# Patient Record
Sex: Male | Born: 1965 | Race: Black or African American | Hispanic: No | Marital: Married | Smoking: Current every day smoker
Health system: Southern US, Community
[De-identification: ages and names within clinical notes are randomized; demographics above are authoritative.]

## PROBLEM LIST (undated history)

## (undated) DIAGNOSIS — K449 Diaphragmatic hernia without obstruction or gangrene: Secondary | ICD-10-CM

## (undated) DIAGNOSIS — J45909 Unspecified asthma, uncomplicated: Secondary | ICD-10-CM

## (undated) DIAGNOSIS — I10 Essential (primary) hypertension: Secondary | ICD-10-CM

## (undated) DIAGNOSIS — E119 Type 2 diabetes mellitus without complications: Secondary | ICD-10-CM

## (undated) DIAGNOSIS — M1990 Unspecified osteoarthritis, unspecified site: Secondary | ICD-10-CM

## (undated) HISTORY — PX: PROSTATE SURGERY: SHX751

---

## 2016-07-09 ENCOUNTER — Other Ambulatory Visit: Payer: Self-pay

## 2016-07-30 ENCOUNTER — Ambulatory Visit: Payer: Managed Care, Other (non HMO) | Admitting: Urology

## 2016-08-20 ENCOUNTER — Encounter: Payer: Self-pay | Admitting: Urology

## 2016-08-20 ENCOUNTER — Ambulatory Visit: Payer: Managed Care, Other (non HMO) | Admitting: Urology

## 2016-09-12 ENCOUNTER — Ambulatory Visit: Payer: Managed Care, Other (non HMO) | Admitting: Urology

## 2016-09-12 ENCOUNTER — Encounter: Payer: Self-pay | Admitting: Urology

## 2017-02-18 ENCOUNTER — Emergency Department: Payer: Managed Care, Other (non HMO)

## 2017-02-18 ENCOUNTER — Emergency Department
Admission: EM | Admit: 2017-02-18 | Discharge: 2017-02-18 | Disposition: A | Discharge status: Home / Self Care | Payer: Managed Care, Other (non HMO) | Attending: Emergency Medicine | Admitting: Emergency Medicine

## 2017-02-18 ENCOUNTER — Other Ambulatory Visit: Payer: Self-pay

## 2017-02-18 ENCOUNTER — Encounter: Payer: Self-pay | Admitting: Emergency Medicine

## 2017-02-18 DIAGNOSIS — R339 Retention of urine, unspecified: Secondary | ICD-10-CM | POA: Insufficient documentation

## 2017-02-18 DIAGNOSIS — R109 Unspecified abdominal pain: Secondary | ICD-10-CM

## 2017-02-18 DIAGNOSIS — I10 Essential (primary) hypertension: Secondary | ICD-10-CM | POA: Diagnosis not present

## 2017-02-18 DIAGNOSIS — E119 Type 2 diabetes mellitus without complications: Secondary | ICD-10-CM | POA: Diagnosis not present

## 2017-02-18 HISTORY — DX: Diaphragmatic hernia without obstruction or gangrene: K44.9

## 2017-02-18 HISTORY — DX: Essential (primary) hypertension: I10

## 2017-02-18 HISTORY — DX: Type 2 diabetes mellitus without complications: E11.9

## 2017-02-18 LAB — URINALYSIS, COMPLETE (UACMP) WITH MICROSCOPIC
Bacteria, UA: NONE SEEN
Bilirubin Urine: NEGATIVE
Glucose, UA: >=500 mg/dL — AB
Hgb urine dipstick: NEGATIVE
Ketones, ur: NEGATIVE mg/dL
LEUKOCYTES UA: NEGATIVE
Nitrite: NEGATIVE
PH: 5 (ref 5.0–8.0)
Protein, ur: NEGATIVE mg/dL
SPECIFIC GRAVITY, URINE: 1.024 (ref 1.005–1.030)

## 2017-02-18 LAB — BASIC METABOLIC PANEL
ANION GAP: 13 (ref 5–15)
BUN: 21 mg/dL — ABNORMAL HIGH (ref 6–20)
CHLORIDE: 102 mmol/L (ref 101–111)
CO2: 21 mmol/L — AB (ref 22–32)
Calcium: 9.8 mg/dL (ref 8.9–10.3)
Creatinine, Ser: 1.17 mg/dL (ref 0.61–1.24)
GFR calc non Af Amer: >60 mL/min (ref >60)
Glucose, Bld: 233 mg/dL — ABNORMAL HIGH (ref 65–99)
Potassium: 4.1 mmol/L (ref 3.5–5.1)
Sodium: 136 mmol/L (ref 135–145)

## 2017-02-18 LAB — CBC
HCT: 41 % (ref 40.0–52.0)
HEMOGLOBIN: 13.8 g/dL (ref 13.0–18.0)
MCH: 28.3 pg (ref 26.0–34.0)
MCHC: 33.6 g/dL (ref 32.0–36.0)
MCV: 84.1 fL (ref 80.0–100.0)
Platelets: 226 10*3/uL (ref 150–440)
RBC: 4.87 MIL/uL (ref 4.40–5.90)
RDW: 13.7 % (ref 11.5–14.5)
WBC: 5.1 10*3/uL (ref 3.8–10.6)

## 2017-02-18 MED ORDER — TAMSULOSIN HCL 0.4 MG PO CAPS: 0.4000 mg | ORAL_CAPSULE | Freq: Every day | ORAL | 0 refills | Status: DC

## 2017-02-18 MED ORDER — OXYCODONE-ACETAMINOPHEN 5-325 MG PO TABS: 1.0000 | ORAL_TABLET | Freq: Four times a day (QID) | ORAL | 0 refills | Status: DC | PRN

## 2017-02-18 NOTE — ED Triage Notes (Signed)
Pt reports left side flank pain and difficulty urinating for one day. Denies nausea or vomiting. Pt ambulatory to triage, no apparent distress noted.

## 2017-02-18 NOTE — ED Notes (Signed)
Bladder scan 278 mL

## 2017-02-18 NOTE — ED Notes (Signed)
Pt has on bladder scan post urination. EDP notified.

## 2017-02-18 NOTE — ED Notes (Signed)
Pt c/o left flank pain and pressure/pain with urination for the past day..Marland Kitchen

## 2017-02-18 NOTE — ED Provider Notes (Signed)
[facility] Emergency Department Provider Note  ____________________________________________   First MD Initiated Contact with Patient 02/18/17 1451     (approximate)  I have reviewed the triage vital signs and the nursing notes.   HISTORY  Chief Complaint Flank Pain   HPI Michael Orozco is a 51 y.o. male with a history of kidney stones as well as hypertension and diabetes who is presenting to the emergency department 2 days of left lower back pain as well as difficulty urinating that just started today.  He says that the pain in his back is a 7 out of 10 and feels like a cramping and sharp pain.  He says that it is improved when he gets up and walks around.  Denies any heavy lifting or known injury.  Says that he also had difficulty urinating today but was able to have a good void just prior to my exam, about 20 minutes prior, and feels like he is completely empty at this time.  Says that during that time he had pain in his lower abdomen that radiated into his testicles.  Denies seeing any blood in his urine.  Says that he also takes medicine for his diabetes but is not taking any today.  Denies any nausea, vomiting or diarrhea.  Patient denies any new medications and specifically asked about allergy medications are Benadryl and he denies.  Past Medical History:  Diagnosis Date  . Diabetes mellitus without complication (HCC)   . Hiatal hernia   . Hypertension     There are no active problems to display for this patient.   History reviewed. No pertinent surgical history.  Prior to Admission medications   Not on File    Allergies Patient has no known allergies.  No family history on file.  Social History Social History   Tobacco Use  . Smoking status: Not on file  Substance Use Topics  . Alcohol use: Not on file  . Drug use: Not on file    Review of Systems  Constitutional: No fever/chills Eyes: No visual changes. ENT: No sore  throat. Cardiovascular: Denies chest pain. Respiratory: Denies shortness of breath. Gastrointestinal: No nausea, no vomiting.  No diarrhea.  No constipation. Genitourinary: Negative for dysuria. Musculoskeletal: As above Skin: Negative for rash. Neurological: Negative for headaches, focal weakness or numbness.   ____________________________________________   PHYSICAL EXAM:  VITAL SIGNS: ED Triage Vitals  Enc Vitals Group     BP 02/18/17 1349 (!) 159/94     Pulse Rate 02/18/17 1349 75     Resp 02/18/17 1349 18     Temp 02/18/17 1349 98.2 F (36.8 C)     Temp Source 02/18/17 1349 Oral     SpO2 02/18/17 1349 97 %     Weight 02/18/17 1349 210 lb (95.3 kg)     Height 02/18/17 1349 6' (1.829 m)     Head Circumference --      Peak Flow --      Pain Score 02/18/17 1348 7     Pain Loc --      Pain Edu? --      Excl. in [place]? --     Constitutional: Alert and oriented. Well appearing and in no acute distress. Eyes: Conjunctivae are normal.  Head: Atraumatic. Nose: No congestion/rhinnorhea. Mouth/Throat: Mucous membranes are moist.  Neck: No stridor.   Cardiovascular: Normal rate, regular rhythm. Grossly normal heart sounds.  Good peripheral circulation. Respiratory: Normal respiratory effort.  No retractions. Lungs CTAB.  Gastrointestinal: Soft and nontender. No distention. No CVA tenderness. Musculoskeletal: No lower extremity tenderness nor edema.  No joint effusions. Neurologic:  Normal speech and language. No gross focal neurologic deficits are appreciated. Skin:  Skin is warm, dry and intact. No rash noted. Psychiatric: Mood and affect are normal. Speech and behavior are normal.  ____________________________________________   LABS (all labs ordered are listed, but only abnormal results are displayed)  Labs Reviewed  URINALYSIS, COMPLETE (UACMP) WITH MICROSCOPIC - Abnormal; Notable for the following components:      Result Value   Color, Urine YELLOW (*)    APPearance  CLEAR (*)    Glucose, UA >=500 (*)    Squamous Epithelial / LPF 0-5 (*)    All other components within normal limits  BASIC METABOLIC PANEL - Abnormal; Notable for the following components:   CO2 21 (*)    Glucose, Bld 233 (*)    BUN 21 (*)    All other components within normal limits  CBC   ____________________________________________  EKG   ____________________________________________  RADIOLOGY  Initial post void residual taken about 30 minutes after urination was 278.  We will have the patient urinate once again and immediately take the post void residual.  Formal postvoid residual at 190.  Small cysts on the ultrasound but without any other pathology. ____________________________________________   PROCEDURES  Procedure(s) performed:   Procedures  Critical Care performed:   ____________________________________________   INITIAL IMPRESSION / ASSESSMENT AND PLAN / ED COURSE  Pertinent labs & imaging results that were available during my care of the patient were reviewed by me and considered in my medical decision making (see chart for details).  Differential diagnosis includes, but is not limited to, acute appendicitis, renal colic, testicular torsion, urinary tract infection/pyelonephritis, prostatitis,  epididymitis, diverticulitis, small bowel obstruction or ileus, colitis, abdominal aortic aneurysm, gastroenteritis, hernia, etc.  As part of my medical decision making, I reviewed the following data within the electronic MEDICAL RECORD NUMBER Notes from prior ED visits   ----------------------------------------- 5:01 PM on 02/18/2017 -----------------------------------------  Patient was still abnormal post void residual.  However, does not appear to be in any discomfort.  Unclear reason for the patient's urinary obstruction.  We will start with Flomax at home and will give patient Percocet for possible stone that was unseen on ultrasound.  Patient to follow-up  with urology.  He knows to return for any worsening or concerning symptoms especially worsening abdominal pain, fever or nausea or vomiting.  He is understanding of this plan willing to comply.     ____________________________________________   FINAL CLINICAL IMPRESSION(S) / ED DIAGNOSES  Urinary retention.  Flank pain.    NEW MEDICATIONS STARTED DURING THIS VISIT:  This SmartLink is deprecated. Use AVSMEDLIST instead to display the medication list for a patient.   Note:  This document was prepared using Dragon voice recognition software and may include unintentional dictation errors.     Myrna BlazerSchaevitz, Arlee Santosuosso Matthew, MD 02/18/17 [phone]

## 2019-02-23 ENCOUNTER — Other Ambulatory Visit: Payer: Self-pay

## 2019-02-23 ENCOUNTER — Emergency Department
Admission: EM | Admit: 2019-02-23 | Discharge: 2019-02-23 | Disposition: A | Discharge status: Home / Self Care | Payer: Managed Care, Other (non HMO) | Attending: Emergency Medicine | Admitting: Emergency Medicine

## 2019-02-23 ENCOUNTER — Emergency Department: Payer: Managed Care, Other (non HMO)

## 2019-02-23 ENCOUNTER — Encounter: Payer: Self-pay | Admitting: Medical Oncology

## 2019-02-23 DIAGNOSIS — R109 Unspecified abdominal pain: Secondary | ICD-10-CM

## 2019-02-23 DIAGNOSIS — Z79899 Other long term (current) drug therapy: Secondary | ICD-10-CM | POA: Insufficient documentation

## 2019-02-23 DIAGNOSIS — R300 Dysuria: Secondary | ICD-10-CM | POA: Insufficient documentation

## 2019-02-23 DIAGNOSIS — R110 Nausea: Secondary | ICD-10-CM | POA: Insufficient documentation

## 2019-02-23 DIAGNOSIS — I10 Essential (primary) hypertension: Secondary | ICD-10-CM | POA: Insufficient documentation

## 2019-02-23 DIAGNOSIS — E119 Type 2 diabetes mellitus without complications: Secondary | ICD-10-CM | POA: Insufficient documentation

## 2019-02-23 DIAGNOSIS — R1032 Left lower quadrant pain: Secondary | ICD-10-CM | POA: Insufficient documentation

## 2019-02-23 LAB — URINALYSIS, COMPLETE (UACMP) WITH MICROSCOPIC
Bacteria, UA: NONE SEEN
Bilirubin Urine: NEGATIVE
Glucose, UA: NEGATIVE mg/dL
Hgb urine dipstick: NEGATIVE
Ketones, ur: NEGATIVE mg/dL
Leukocytes,Ua: NEGATIVE
Nitrite: NEGATIVE
Protein, ur: NEGATIVE mg/dL
Specific Gravity, Urine: 1.009 (ref 1.005–1.030)
pH: 6 (ref 5.0–8.0)

## 2019-02-23 LAB — CBC
HCT: 42.1 % (ref 39.0–52.0)
Hemoglobin: 14.1 g/dL (ref 13.0–17.0)
MCH: 28.8 pg (ref 26.0–34.0)
MCHC: 33.5 g/dL (ref 30.0–36.0)
MCV: 86.1 fL (ref 80.0–100.0)
Platelets: 239 10*3/uL (ref 150–400)
RBC: 4.89 MIL/uL (ref 4.22–5.81)
RDW: 12.4 % (ref 11.5–15.5)
WBC: 6.2 10*3/uL (ref 4.0–10.5)
nRBC: 0 % (ref 0.0–0.2)

## 2019-02-23 LAB — BASIC METABOLIC PANEL
Anion gap: 8 (ref 5–15)
BUN: 12 mg/dL (ref 6–20)
CO2: 23 mmol/L (ref 22–32)
Calcium: 9.5 mg/dL (ref 8.9–10.3)
Chloride: 108 mmol/L (ref 98–111)
Creatinine, Ser: 0.94 mg/dL (ref 0.61–1.24)
GFR calc Af Amer: >60 mL/min (ref >60)
GFR calc non Af Amer: >60 mL/min (ref >60)
Glucose, Bld: 90 mg/dL (ref 70–99)
Potassium: 4.3 mmol/L (ref 3.5–5.1)
Sodium: 139 mmol/L (ref 135–145)

## 2019-02-23 MED ORDER — OXYCODONE-ACETAMINOPHEN 5-325 MG PO TABS
1.0000 | ORAL_TABLET | Freq: Once | ORAL | Status: AC
Start: 2019-02-23 — End: 2019-02-23
  Administered 2019-02-23: 1 via ORAL
  Filled 2019-02-23: qty 1

## 2019-02-23 MED ORDER — HALOPERIDOL LACTATE 5 MG/ML IJ SOLN
2.0000 mg | Freq: Once | INTRAMUSCULAR | Status: DC
Start: 2019-02-23 — End: 2019-02-23

## 2019-02-23 MED ORDER — CYCLOBENZAPRINE HCL 5 MG PO TABS: 5.0000 mg | ORAL_TABLET | Freq: Three times a day (TID) | ORAL | 0 refills | Status: DC | PRN

## 2019-02-23 MED ORDER — ONDANSETRON 4 MG PO TBDP
4.0000 mg | ORAL_TABLET | Freq: Once | ORAL | Status: AC
  Administered 2019-02-23: 4 mg via ORAL
  Filled 2019-02-23: qty 1

## 2019-02-23 NOTE — ED Provider Notes (Signed)
[facility] Emergency Department Provider Note _______________   First MD Initiated Contact with Patient 02/23/19 1158     (approximate)  I have reviewed the triage vital signs and the nursing notes.   HISTORY  Chief Complaint Flank Pain, Dysuria, and Nausea    HPI Michael Orozco is a 53 y.o. male with below list of previous medical conditions presents to the emergency department with a 10-day history of left flank pain that is currently 8 out of 10.  Patient does admit to some urinary pressure however no pain.  Patient denies any hematuria.  Patient does admit to nausea however no fever.        Past Medical History:  Diagnosis Date  . Diabetes mellitus without complication ([place])   . Hiatal hernia   . Hypertension     There are no active problems to display for this patient.   History reviewed. No pertinent surgical history.  Prior to Admission medications   Medication Sig Start Date End Date Taking? Authorizing Provider  oxyCODONE-acetaminophen (ROXICET) 5-325 MG tablet Take 1-2 tablets by mouth every 6 (six) hours as needed. 02/18/17   Schaevitz, Randall An, MD  tamsulosin (FLOMAX) 0.4 MG CAPS capsule Take 1 capsule (0.4 mg total) by mouth daily. 02/18/17   Schaevitz, Randall An, MD    Allergies Patient has no known allergies.  No family history on file.  Social History Social History   Tobacco Use  . Smoking status: Not on file  Substance Use Topics  . Alcohol use: Not on file  . Drug use: Not on file    Review of Systems Constitutional: No fever/chills Eyes: No visual changes. ENT: No sore throat. Cardiovascular: Denies chest pain. Respiratory: Denies shortness of breath. Gastrointestinal: .  Positive for left flank pain and nausea Genitourinary: Negative for dysuria. Musculoskeletal: Negative for neck pain.  Negative for back pain. Integumentary: Negative for rash. Neurological: Negative for headaches, focal  weakness or numbness.   ____________________________________________   PHYSICAL EXAM:  VITAL SIGNS: ED Triage Vitals [02/23/19 1128]  Enc Vitals Group     BP (!) 190/99     Pulse Rate 76     Resp 18     Temp 98.2 F (36.8 C)     Temp Source Oral     SpO2 99 %     Weight 86.2 kg (190 lb)     Height 1.829 m (6')     Head Circumference      Peak Flow      Pain Score 8     Pain Loc      Pain Edu?      Excl. in [place]?     Constitutional: Alert and oriented.  Eyes: Conjunctivae are normal.  Mouth/Throat: Patient is wearing a mask. Neck: No stridor.  No meningeal signs.   Cardiovascular: Normal rate, regular rhythm. Good peripheral circulation. Grossly normal heart sounds. Respiratory: Normal respiratory effort.  No retractions. Gastrointestinal: Soft and nontender. No distention.  Musculoskeletal: No lower extremity tenderness nor edema. No gross deformities of extremities. Neurologic:  Normal speech and language. No gross focal neurologic deficits are appreciated.  Skin:  Skin is warm, dry and intact. Psychiatric: Mood and affect are normal. Speech and behavior are normal.  ____________________________________________   LABS (all labs ordered are listed, but only abnormal results are displayed)  Labs Reviewed  URINALYSIS, COMPLETE (UACMP) WITH MICROSCOPIC - Abnormal; Notable for the following components:      Result Value   Color,  Urine YELLOW (*)    APPearance CLEAR (*)    All other components within normal limits  BASIC METABOLIC PANEL  CBC    RADIOLOGY I, [place] N , personally viewed and evaluated these images (plain radiographs) as part of my medical decision making, as well as reviewing the written report by the radiologist.   Official radiology report(s):  CLINICAL DATA:  Left flank pain  EXAM: CT ABDOMEN AND PELVIS WITHOUT CONTRAST  TECHNIQUE: Multidetector CT imaging of the abdomen and pelvis was performed following the standard protocol  without IV contrast.  COMPARISON:  07/23/2017  FINDINGS: Lower chest: No acute abnormality.  Hepatobiliary: No focal liver abnormality is seen. No gallstones, gallbladder wall thickening, or biliary dilatation.  Pancreas: Unremarkable.  Spleen: Unremarkable.  Adrenals/Urinary Tract: Adrenals are unremarkable. Bilateral renal cysts. Probable punctate nonobstructing calculi at the left lower pole and collecting system seen best on coronal imaging. No ureteral stone identified. Circumferential bladder wall thickening.  Stomach/Bowel: Stomach is unremarkable. Bowel is normal and caliber. Colonic diverticulosis. Appendix is normal.  Vascular/Lymphatic: No adenopathy.  Aortic atherosclerosis.  Reproductive: Prominent prostate indenting the bladder base.  Other: No ascites.  Musculoskeletal: No acute or significant osseous abnormality.  IMPRESSION: Probable few punctate left renal calculi.  Circumferential bladder wall thickening likely related to chronic outlet obstruction.   Electronically Signed   By: Guadlupe Spanish M.D.   On: 02/23/2019 13:11 ____________________________________________   Procedures   ____________________________________________   INITIAL IMPRESSION / MDM / ASSESSMENT AND PLAN / ED COURSE  As part of my medical decision making, I reviewed the following data within the electronic MEDICAL RECORD NUMBER   52 year old male presented with above-stated history and physical exam secondary to left flank pain.  Considered possibly of ureterolithiasis nephrolithiasis diverticulitis.  CT scan revealed few punctate stones.  ____________________________________________  FINAL CLINICAL IMPRESSION(S) / ED DIAGNOSES  Final diagnoses:  Flank pain     MEDICATIONS GIVEN DURING THIS VISIT:  Medications - No data to display   ED Discharge Orders    None      *Please note:  Michael Orozco was evaluated in Emergency Department on 02/23/2019  for the symptoms described in the history of present illness. He was evaluated in the context of the global COVID-19 pandemic, which necessitated consideration that the patient might be at risk for infection with the SARS-CoV-2 virus that causes COVID-19. Institutional protocols and algorithms that pertain to the evaluation of patients at risk for COVID-19 are in a state of rapid change based on information released by regulatory bodies including the CDC and federal and state organizations. These policies and algorithms were followed during the patient's care in the ED.  Some ED evaluations and interventions may be delayed as a result of limited staffing during the pandemic.*  Note:  This document was prepared using Dragon voice recognition software and may include unintentional dictation errors.   Darci Current, MD 02/27/19 2122

## 2019-02-23 NOTE — ED Notes (Signed)
Patient transported to CT 

## 2019-02-23 NOTE — ED Triage Notes (Signed)
Pt reports he began having left sided flank pain 10 days ago- pt reports she has been having pain with urination for a few days with nausea.

## 2019-02-23 NOTE — ED Notes (Signed)
Pt reports he has been out of his BP meds for about 3 days.

## 2019-02-23 NOTE — ED Provider Notes (Signed)
1:22 PM Assumed care for off going team.   Blood pressure (!) 190/99, pulse 76, temperature 98.2 F (36.8 C), temperature source Oral, resp. rate 18, height 6' (1.829 m), weight 86.2 kg, SpO2 99 %.  See their HPI for full report but in brief patient pending CT scan.  If negative can be discharged home.  CT scan without evidence of kidney stone.  Does have chronic outlet obstruction.  I did discuss with patient he is not interested in having a Foley or having a bladder scan done at this time to evaluate for foley.  He already has a urologist that he can follow-up with.  He says he still able to urinate though does have some pressure with it and he has a history of an enlarged prostate most likely causing the above on the CT scan. No evidence of hydro/kidney function to suggest severe obstruction.  Patient will follow-up with his PCP for above as well as his urologist for above.   I discussed the provisional nature of ED diagnosis, the treatment so far, the ongoing plan of care, follow up appointments and return precautions with the patient and any family or support people present. They expressed understanding and agreed with the plan, discharged home.           Vanessa Loretto, MD 02/23/19 1324

## 2019-02-23 NOTE — Discharge Instructions (Signed)
Your CT scan was reassuring however there is concern for enlarged prostate patient follow-up with urology for this.  This is most likely musculoskeletal in nature.  Take 1 g of Tylenol every 8 hours.  Your blood pressure was also elevated.  ollow-up with your primary care doctor as well.  Return the ER for fevers, worsening pain or any other concerns  IMPRESSION:  Probable few punctate left renal calculi.     Circumferential bladder wall thickening likely related to chronic  outlet obstruction.

## 2019-03-31 ENCOUNTER — Ambulatory Visit: Payer: BC Managed Care – PPO | Attending: Internal Medicine

## 2019-03-31 DIAGNOSIS — Z20822 Contact with and (suspected) exposure to covid-19: Secondary | ICD-10-CM | POA: Insufficient documentation

## 2019-04-02 ENCOUNTER — Telehealth: Payer: Self-pay | Admitting: *Deleted

## 2019-04-02 LAB — NOVEL CORONAVIRUS, NAA: SARS-CoV-2, NAA: NOT DETECTED

## 2019-04-02 NOTE — Telephone Encounter (Signed)
Patient calling for COVID lab results, results negative, verbalized understanding.  

## 2019-04-02 NOTE — Telephone Encounter (Signed)
Patient called and would like results faxed to his employer [employer] at [phone]

## 2019-04-03 ENCOUNTER — Telehealth: Payer: Self-pay

## 2019-04-03 NOTE — Telephone Encounter (Signed)
Result faxed as requested. 

## 2019-09-15 ENCOUNTER — Emergency Department: Payer: BC Managed Care – PPO

## 2019-09-15 ENCOUNTER — Other Ambulatory Visit: Payer: Self-pay

## 2019-09-15 ENCOUNTER — Emergency Department
Admission: EM | Admit: 2019-09-15 | Discharge: 2019-09-15 | Disposition: A | Discharge status: Home / Self Care | Payer: BC Managed Care – PPO | Attending: Student in an Organized Health Care Education/Training Program | Admitting: Student in an Organized Health Care Education/Training Program

## 2019-09-15 ENCOUNTER — Encounter: Payer: Self-pay | Admitting: Emergency Medicine

## 2019-09-15 DIAGNOSIS — I10 Essential (primary) hypertension: Secondary | ICD-10-CM | POA: Insufficient documentation

## 2019-09-15 DIAGNOSIS — X58XXXA Exposure to other specified factors, initial encounter: Secondary | ICD-10-CM | POA: Insufficient documentation

## 2019-09-15 DIAGNOSIS — Y929 Unspecified place or not applicable: Secondary | ICD-10-CM | POA: Insufficient documentation

## 2019-09-15 DIAGNOSIS — S4991XA Unspecified injury of right shoulder and upper arm, initial encounter: Secondary | ICD-10-CM | POA: Diagnosis present

## 2019-09-15 DIAGNOSIS — Y9389 Activity, other specified: Secondary | ICD-10-CM | POA: Diagnosis not present

## 2019-09-15 DIAGNOSIS — S46001A Unspecified injury of muscle(s) and tendon(s) of the rotator cuff of right shoulder, initial encounter: Secondary | ICD-10-CM | POA: Insufficient documentation

## 2019-09-15 DIAGNOSIS — Z79899 Other long term (current) drug therapy: Secondary | ICD-10-CM | POA: Diagnosis not present

## 2019-09-15 DIAGNOSIS — E119 Type 2 diabetes mellitus without complications: Secondary | ICD-10-CM | POA: Diagnosis not present

## 2019-09-15 DIAGNOSIS — Y999 Unspecified external cause status: Secondary | ICD-10-CM | POA: Diagnosis not present

## 2019-09-15 DIAGNOSIS — Z794 Long term (current) use of insulin: Secondary | ICD-10-CM | POA: Diagnosis not present

## 2019-09-15 DIAGNOSIS — S46009A Unspecified injury of muscle(s) and tendon(s) of the rotator cuff of unspecified shoulder, initial encounter: Secondary | ICD-10-CM

## 2019-09-15 MED ORDER — MELOXICAM 15 MG PO TABS: 15.0000 mg | ORAL_TABLET | Freq: Every day | ORAL | 2 refills | Status: AC

## 2019-09-15 NOTE — Discharge Instructions (Signed)
You have a rotator cuff injury.  If you are not improving in 1 week please follow-up with orthopedics.  Take meloxicam as prescribed.  Wear the shoulder immobilizer to reduce inflammation.  Apply ice to the area to decrease inflammation and pain.  Return emergency department for worsening.
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
## 2019-09-15 NOTE — ED Notes (Addendum)
See triage note  Presents with right shoulder pain  States he developed pain when he tried to reach back to wash his back last pm  No deformity noted  Having increased pain with movement  Also wants his left hand checked out  Thinks there is some glass from old injury
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
## 2021-12-04 IMAGING — CT CT RENAL STONE PROTOCOL
2 of 4 series · 16 of 46 positions shown, 18 images · non-contrast
Comparison: 07/23/2017

CLINICAL DATA: Left flank pain

EXAM:
CT ABDOMEN AND PELVIS WITHOUT CONTRAST
TECHNIQUE: Multidetector CT imaging of the abdomen and pelvis was performed
following the standard protocol without IV contrast.

[Series 2: stone full standard · axial · 0.68mm/px · z∈[-843,-403]mm · 13 of 97 slices shown, 15 images]
[im 5/97  soft-tissue]
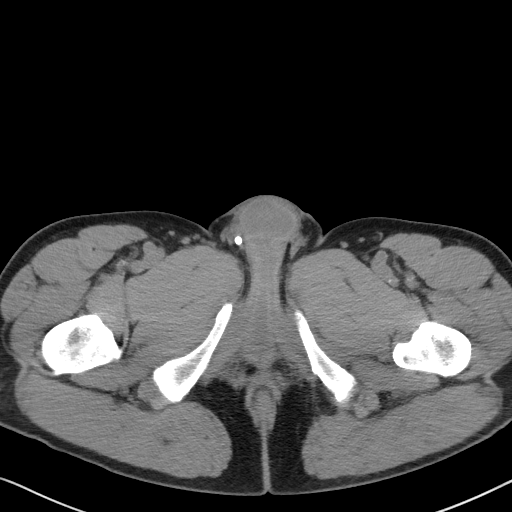
[im 5/97  bone]
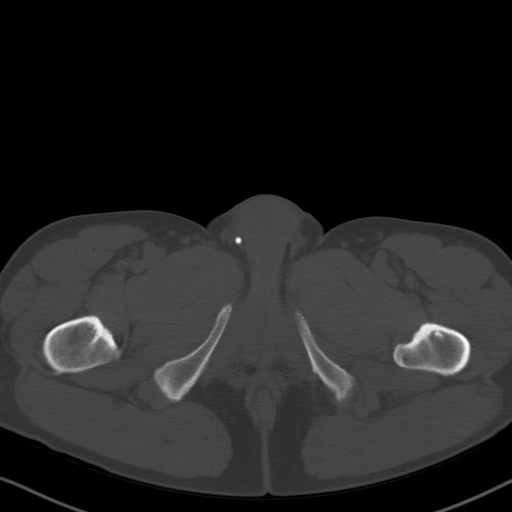
[im 13/97  soft-tissue]
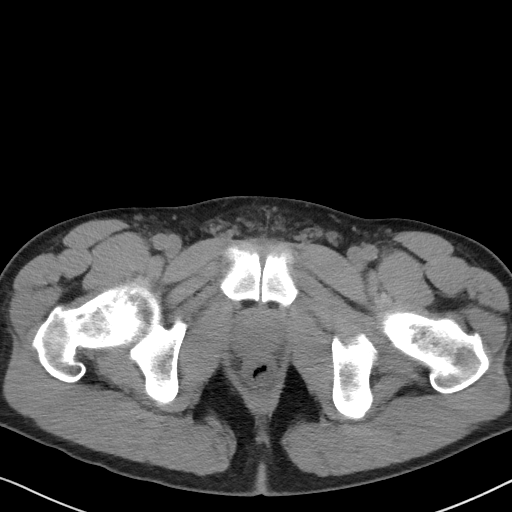
[im 21/97  soft-tissue]
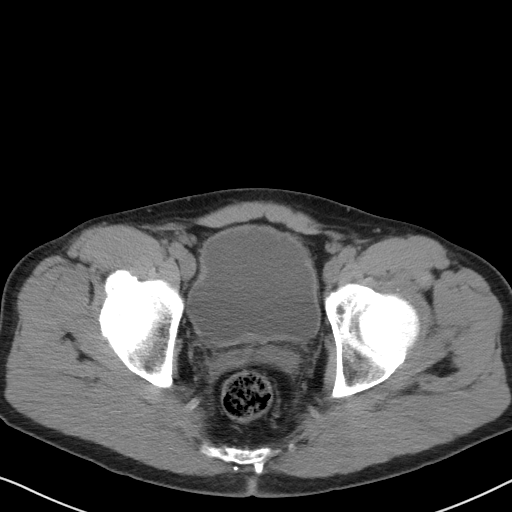
[im 29/97  soft-tissue]
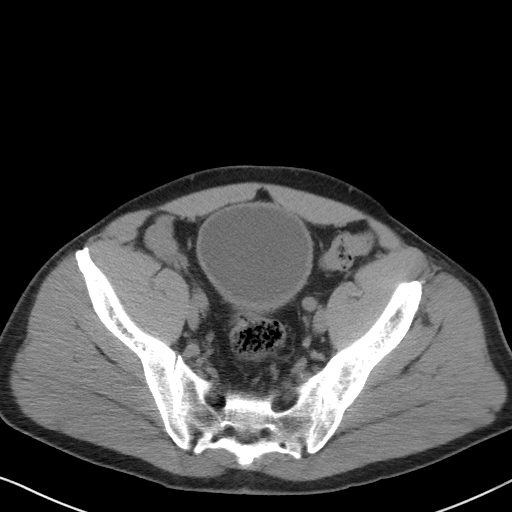
[im 33/97  soft-tissue]
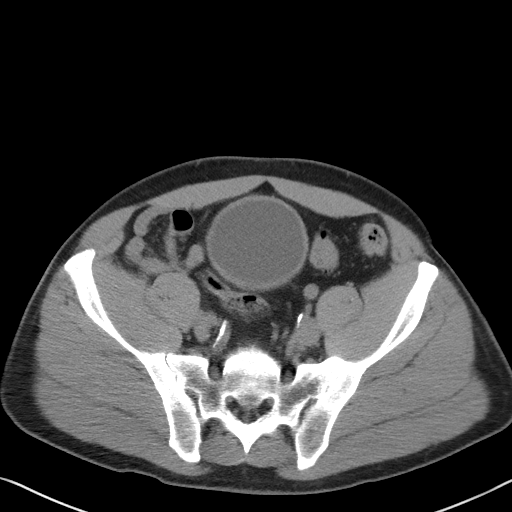
[im 41/97  soft-tissue]
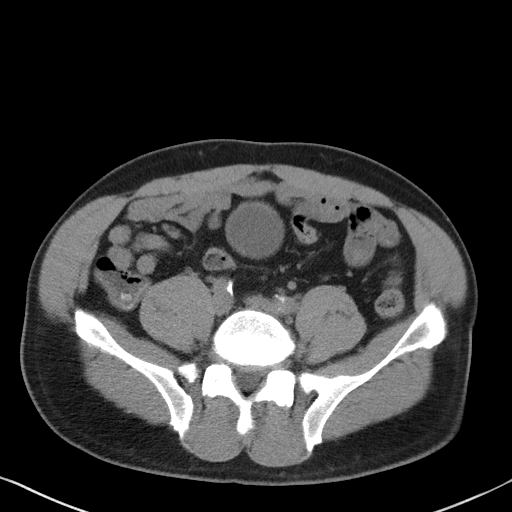
[im 49/97  soft-tissue]
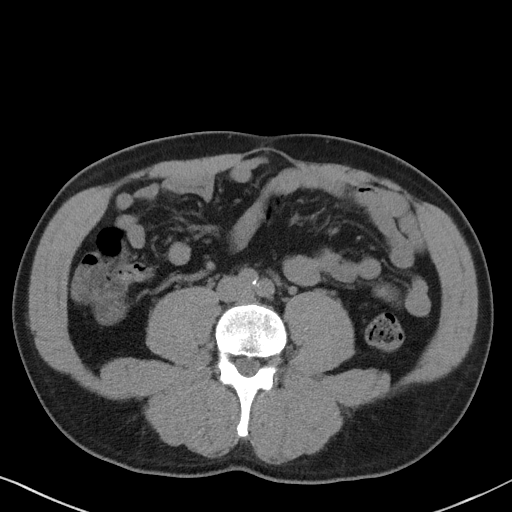
[im 57/97  soft-tissue]
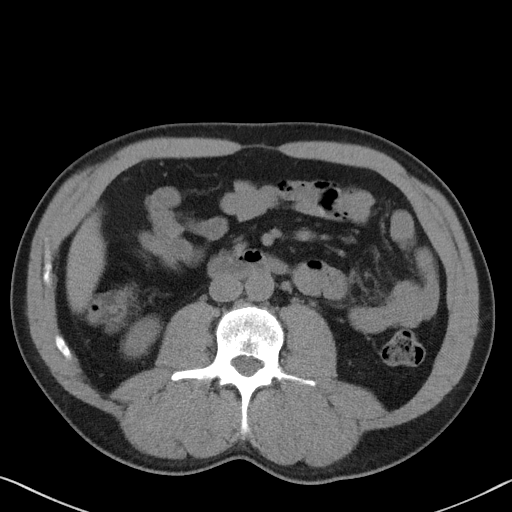
[im 65/97  soft-tissue]
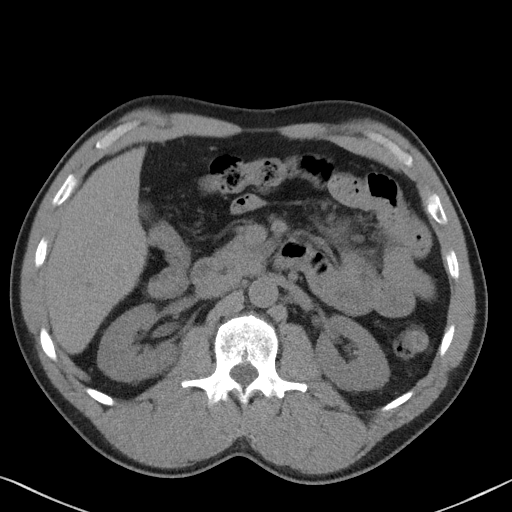
[im 65/97  bone]
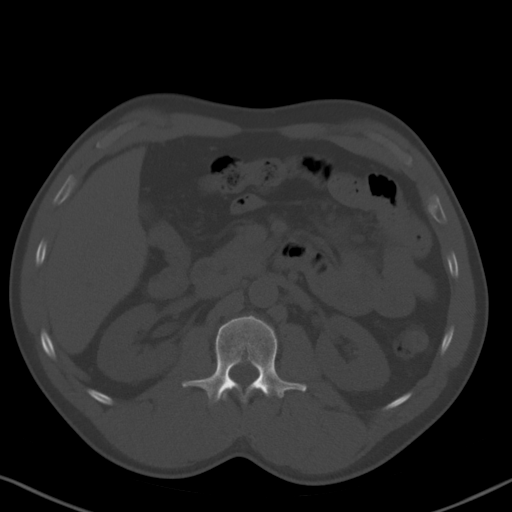
[im 69/97  soft-tissue]
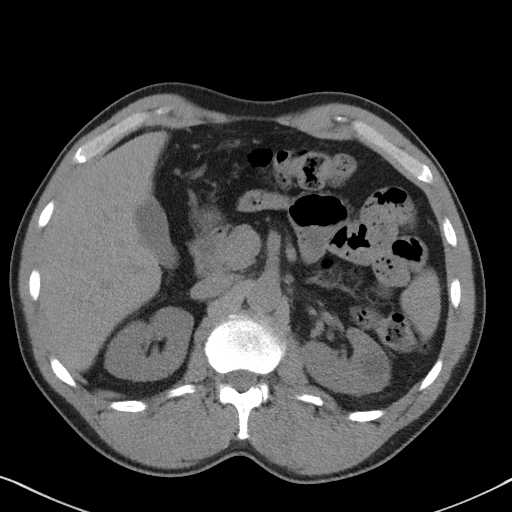
[im 77/97  soft-tissue]
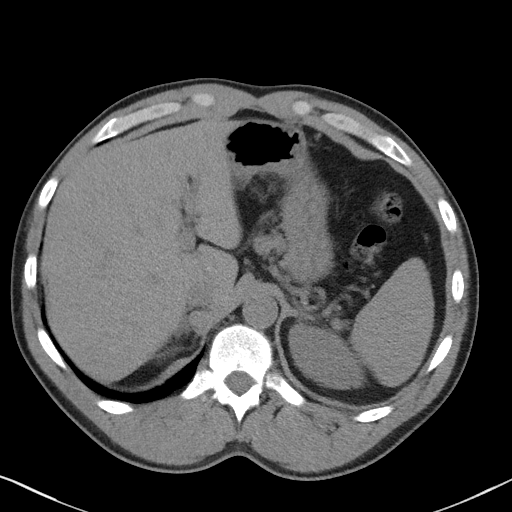
[im 85/97  soft-tissue]
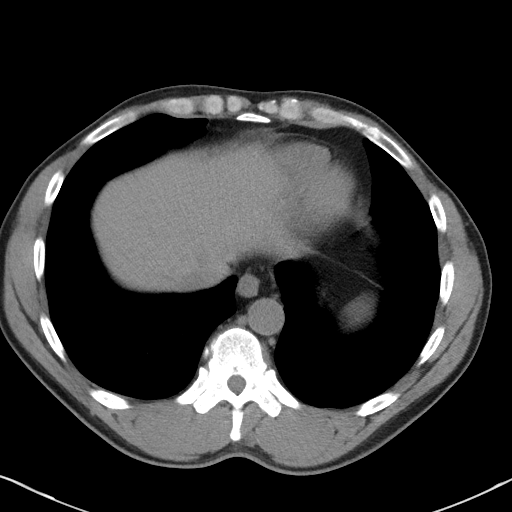
[im 93/97  soft-tissue]
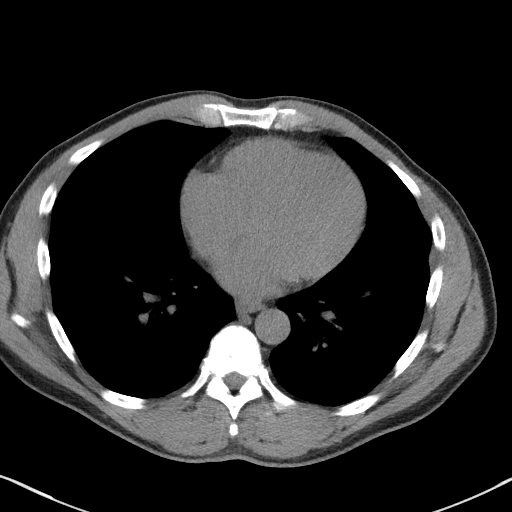

[Series 5: coronal · coronal · 0.66mm/px · 3 of 135 slices shown]
[im 45/135  soft-tissue]
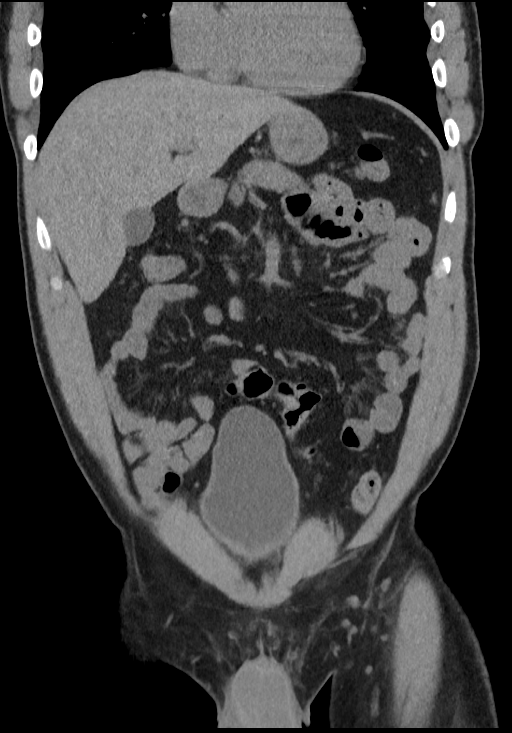
[im 60/135  soft-tissue]
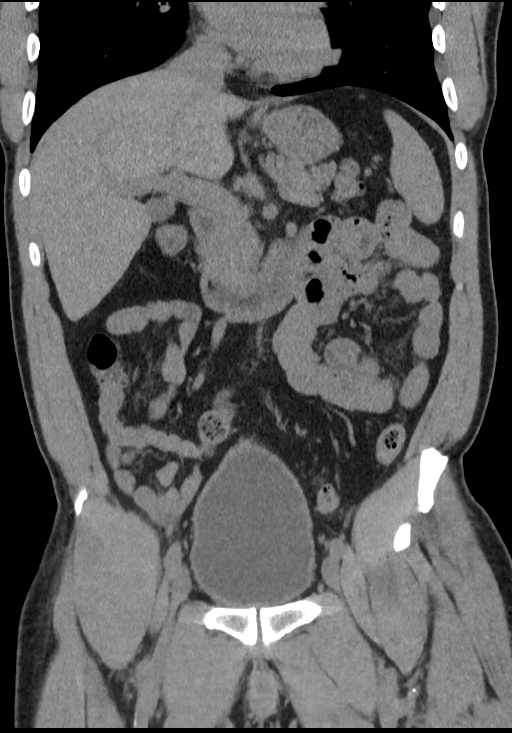
[im 75/135  soft-tissue]
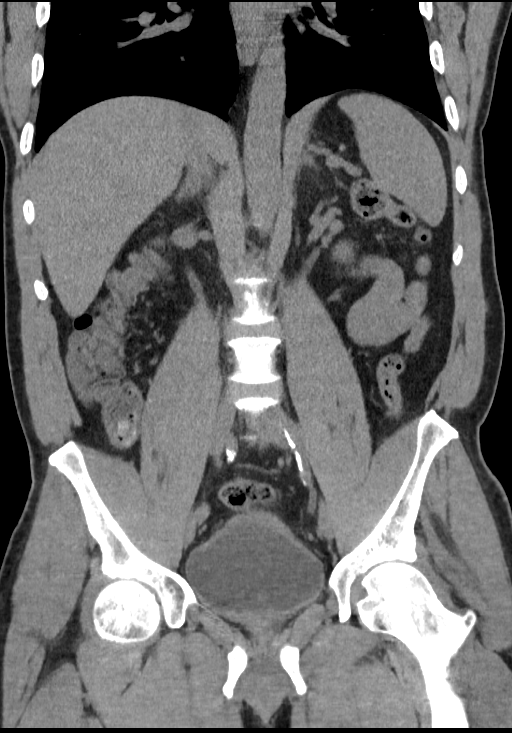

[16 of 46 positions shown; findings below may reference images not displayed]

FINDINGS: Lower chest: No acute abnormality.

Hepatobiliary: No focal liver abnormality is seen. No gallstones,
gallbladder wall thickening, or biliary dilatation.

Pancreas: Unremarkable.

Spleen: Unremarkable.

Adrenals/Urinary Tract: Adrenals are unremarkable. Bilateral renal
cysts. Probable punctate nonobstructing calculi at the left lower
pole and collecting system seen best on coronal imaging. No ureteral
stone identified. Circumferential bladder wall thickening.

Stomach/Bowel: Stomach is unremarkable. Bowel is normal and caliber.
Colonic diverticulosis. Appendix is normal.

Vascular/Lymphatic: No adenopathy.  Aortic atherosclerosis.

Reproductive: Prominent prostate indenting the bladder base.

Other: No ascites.

Musculoskeletal: No acute or significant osseous abnormality.
IMPRESSION: Probable few punctate left renal calculi.

Circumferential bladder wall thickening likely related to chronic
outlet obstruction.

## 2022-06-26 IMAGING — CR DG SHOULDER 2+V*R*
1 series · 3 of 3 positions shown · non-contrast
Comparison: None.

CLINICAL DATA: 54-year-old male with right shoulder pain since
yesterday. Painful range of motion.

EXAM:
RIGHT SHOULDER - 2+ VIEW

[Series 1: dg shoulder right · 0.14mm/px · 3 of 3 slices shown]
[im 1/3]
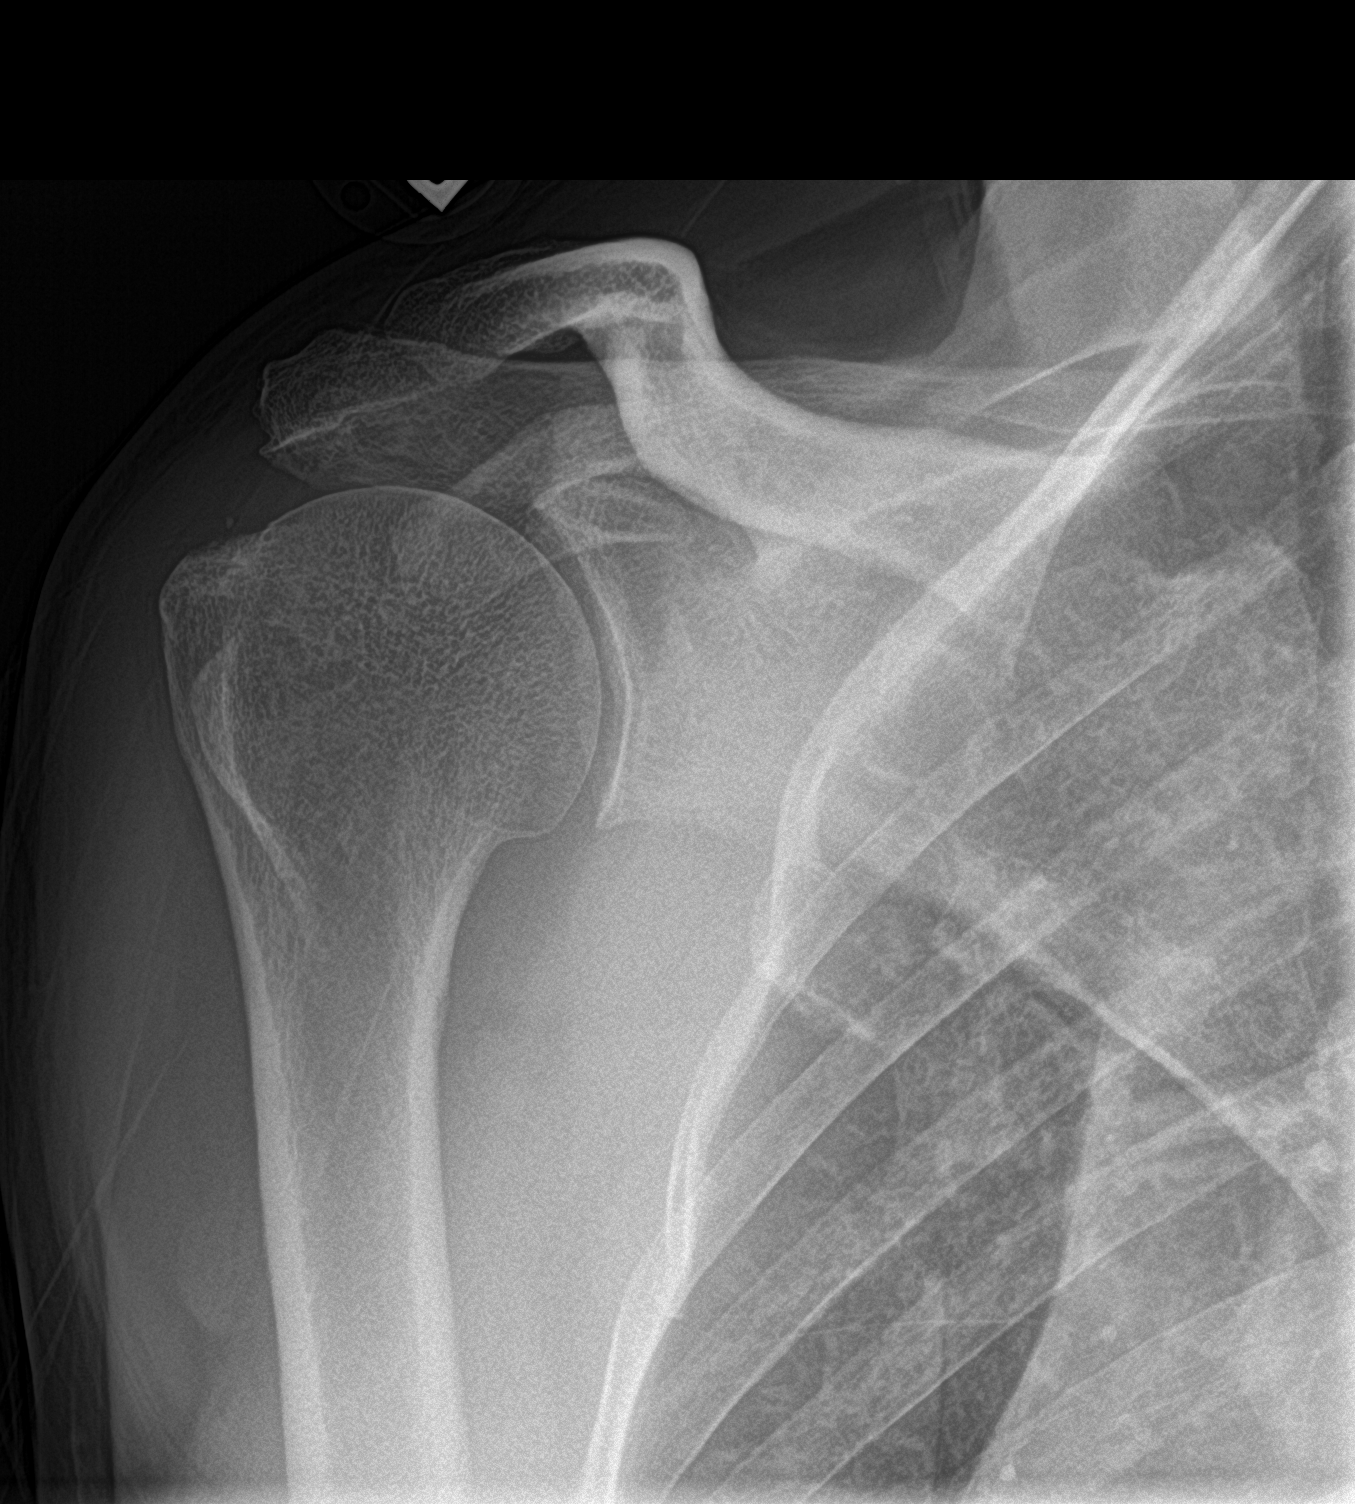
[im 2/3]
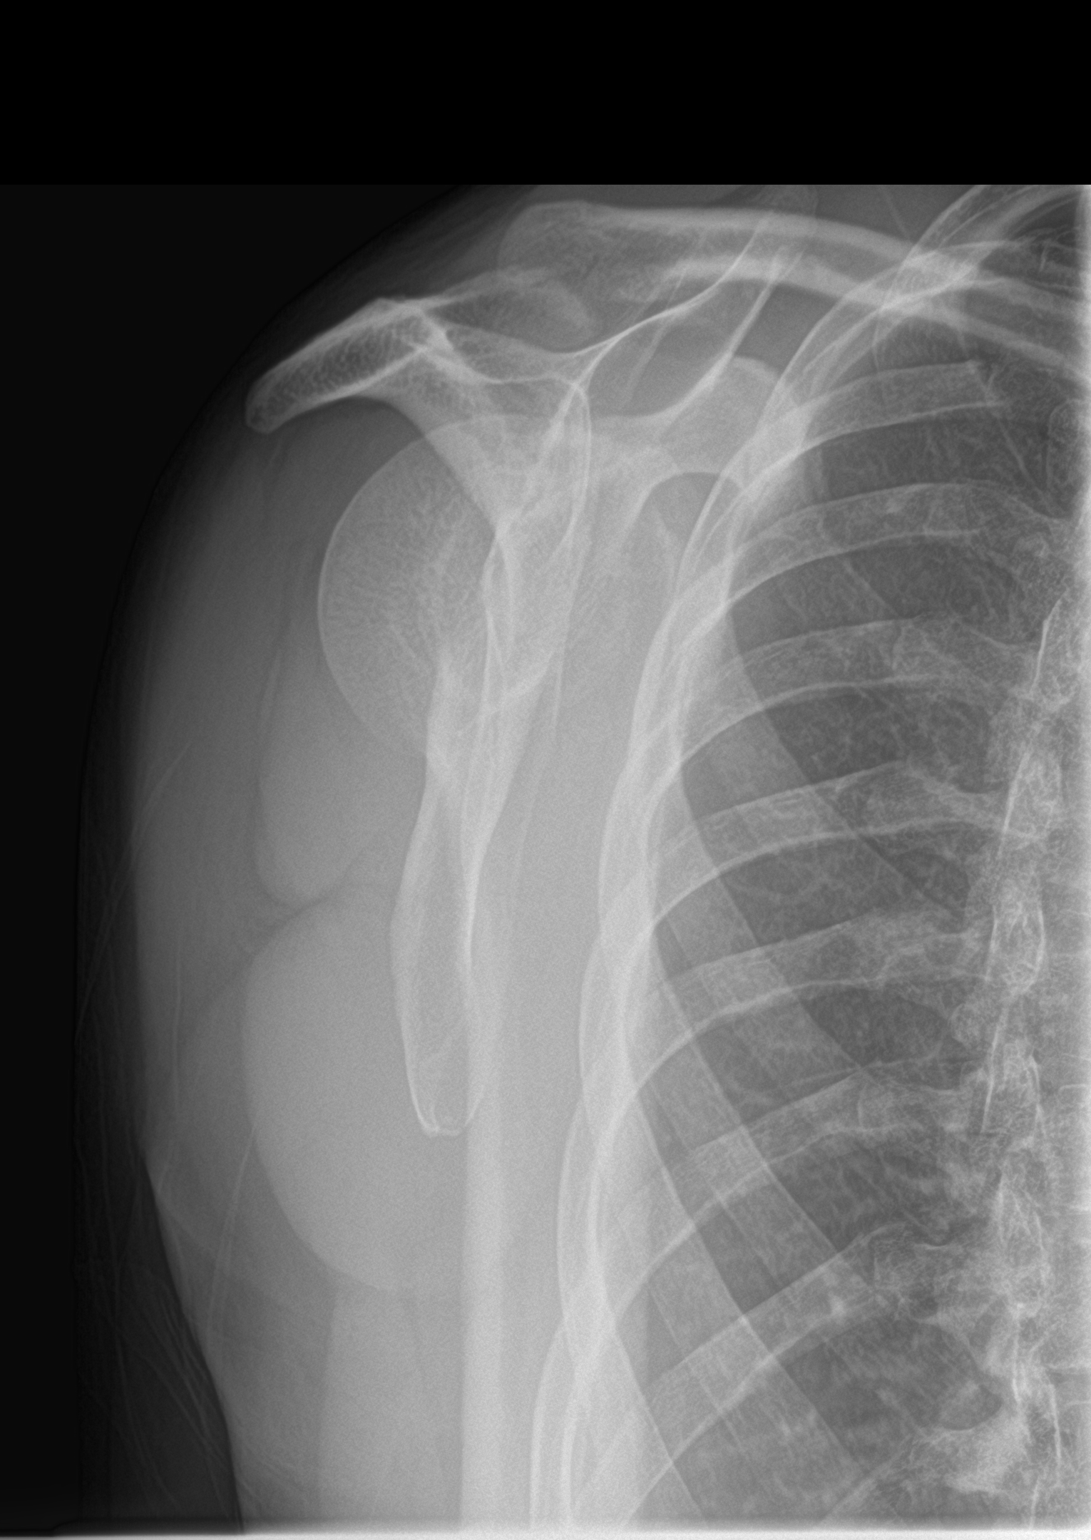
[im 3/3]
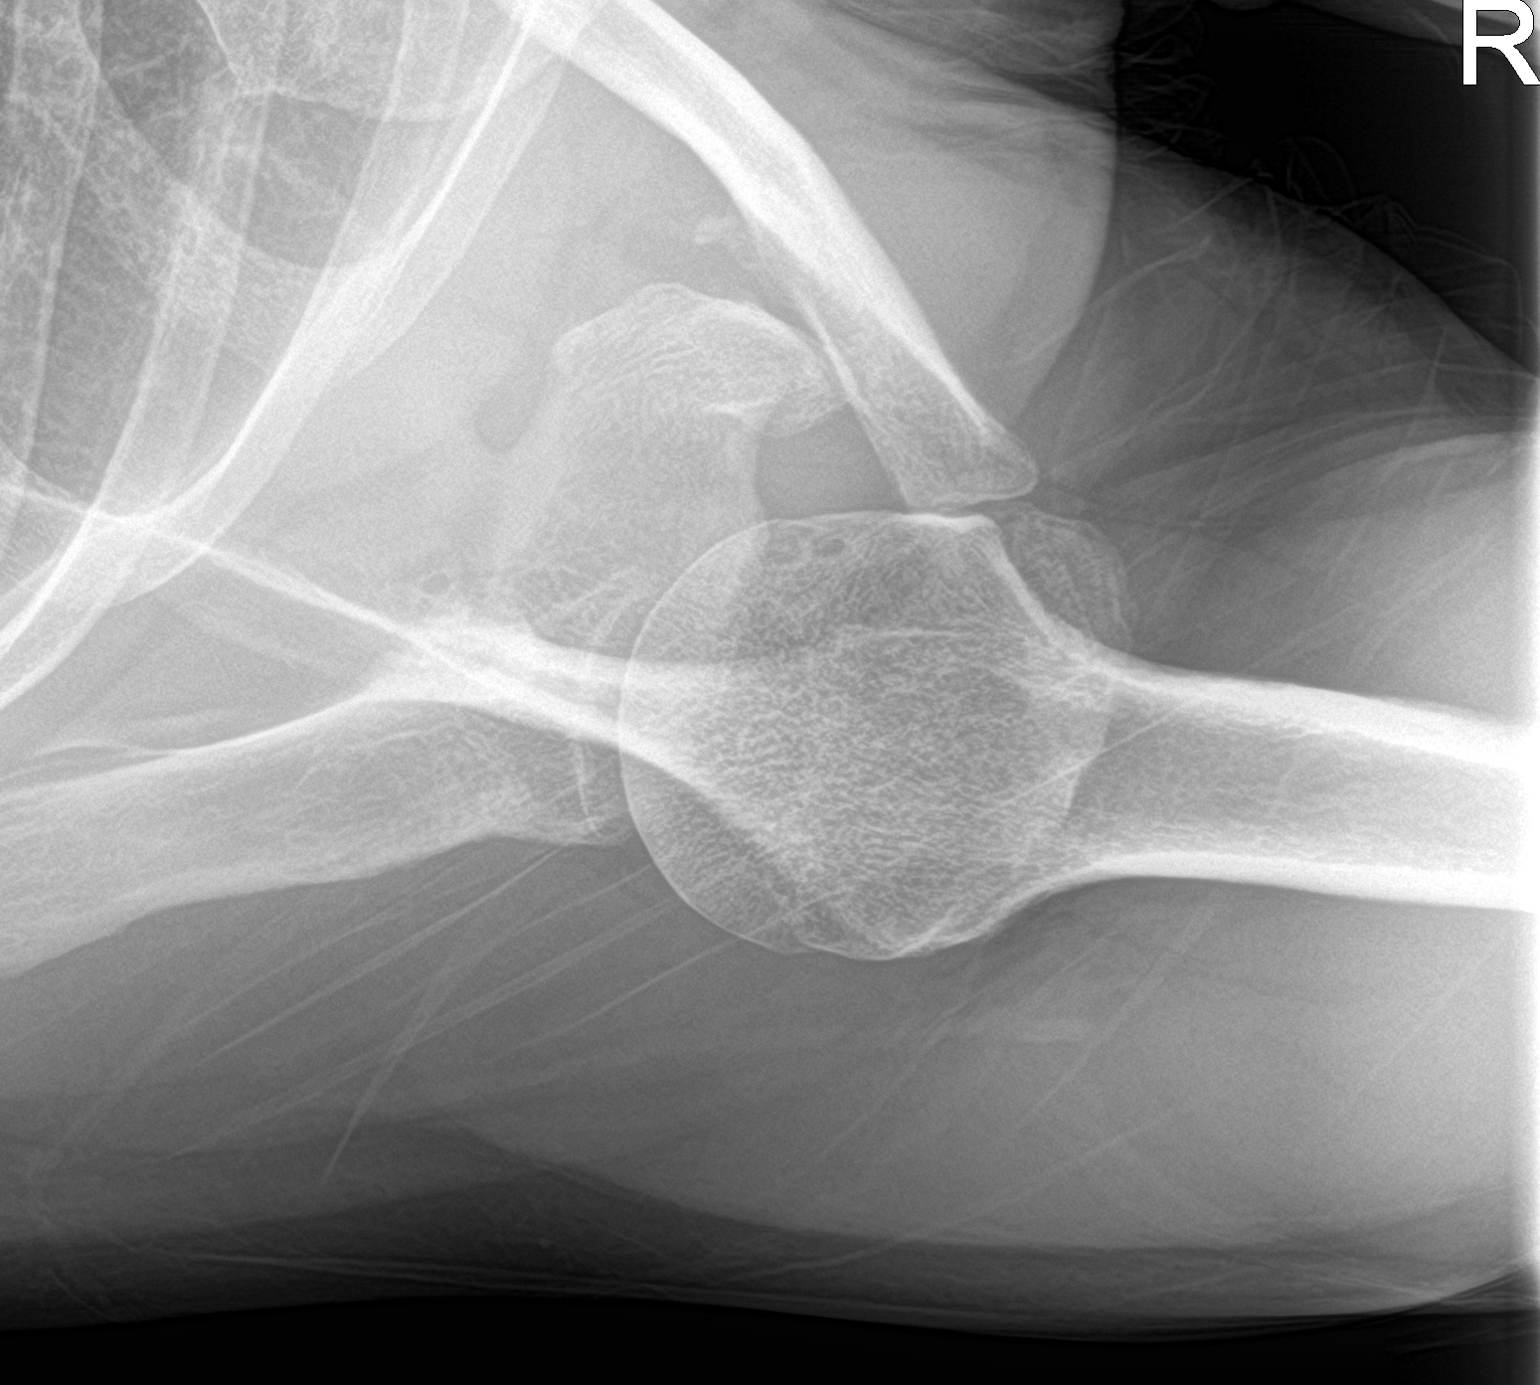

[3 of 3 positions shown; findings below may reference images not displayed]

FINDINGS: Bone mineralization is within normal limits. There is no evidence of
fracture or dislocation. There is no evidence of arthropathy or
other focal bone abnormality. Minimal soft tissue dystrophic
calcification about the shoulder. Negative visible right ribs and
chest.
IMPRESSION: Negative for age.

## 2022-07-11 ENCOUNTER — Encounter: Payer: Self-pay | Admitting: *Deleted

## 2022-10-15 LAB — BASIC METABOLIC PANEL WITH GFR
BUN: 34 — AB (ref 4–21)
Creatinine: 2.4 — AB (ref 0.6–1.3)
Glucose: 686

## 2022-10-15 LAB — LIPID PANEL: Triglycerides: 419 — AB (ref 40–160)

## 2022-10-15 LAB — COMPREHENSIVE METABOLIC PANEL WITH GFR: eGFR: 31

## 2022-12-20 ENCOUNTER — Encounter: Payer: Self-pay | Admitting: *Deleted

## 2023-01-07 LAB — HEMOGLOBIN A1C: Hemoglobin A1C: >15

## 2023-02-27 ENCOUNTER — Encounter: Payer: Self-pay | Admitting: *Deleted

## 2023-05-20 ENCOUNTER — Telehealth: Payer: Self-pay | Admitting: *Deleted

## 2023-05-20 NOTE — Telephone Encounter (Signed)
  Procedure: COLONOSCOPY  Height: 6;0 Weight: 185LBS      Have you had a colonoscopy before?  no  Do you have family history of colon cancer?  no  Do you have a family history of polyps? no  Previous colonoscopy with polyps removed? no  Do you have a history colorectal cancer?   no  Are you diabetic?  Type 2  Do you have a prosthetic or mechanical heart valve? no  Do you have a pacemaker/defibrillator?   no  Have you had endocarditis/atrial fibrillation?  no  Do you use supplemental oxygen/CPAP?  no  Have you had joint replacement within the last 12 months?  no  Do you tend to be constipated or have to use laxatives?  no   Do you have history of alcohol use? If yes, how much and how often.  no  Do you have history or are you using drugs? If yes, what do are you  using?  no  Have you ever had a stroke/heart attack?  no  Have you ever had a heart or other vascular stent placed,?no  Do you take weight loss medication? no  Do you take any blood-thinning medications such as: (Plavix, aspirin, Coumadin, Aggrenox, Brilinta, Xarelto, Eliquis, Pradaxa, Savaysa or Effient)? no  If yes we need the name, milligram, dosage and who is prescribing doctor:               Current Outpatient Medications  Medication Sig Dispense Refill   amLODipine (NORVASC) 5 MG tablet Take 5 mg by mouth daily.     atorvastatin (LIPITOR) 40 MG tablet Take 40 mg by mouth daily.     Continuous Glucose Sensor (DEXCOM G7 SENSOR) MISC      Insulin Aspart FlexPen (NOVOLOG) 100 UNIT/ML 3 Units in the morning, at noon, and at bedtime.     insulin glargine (LANTUS) 100 UNIT/ML injection Inject into the skin daily.     potassium chloride (KLOR-CON) 10 MEQ tablet Take 10 mEq by mouth daily.     No current facility-administered medications for this visit.    No Known Allergies

## 2023-06-10 ENCOUNTER — Other Ambulatory Visit: Payer: Self-pay | Admitting: *Deleted

## 2023-06-10 ENCOUNTER — Encounter: Payer: Self-pay | Admitting: *Deleted

## 2023-06-10 DIAGNOSIS — Z1211 Encounter for screening for malignant neoplasm of colon: Secondary | ICD-10-CM

## 2023-06-10 MED ORDER — PEG 3350-KCL-NA BICARB-NACL 420 G PO SOLR: 4000.0000 mL | Freq: Once | ORAL | 0 refills | Status: DC

## 2023-06-10 NOTE — Telephone Encounter (Signed)
 Pt has been scheduled for 07/09/23 with Dr.Carver. instructions mailed and prep sent to the pharmacy

## 2023-06-10 NOTE — Telephone Encounter (Signed)
 ASA 2.  BMP pre-op Trilyte prep only given CKD.  1/2 dose lantus night prior to procedure. No insulin morning of procedure. Check BG every 4 hours during prep.

## 2023-06-14 ENCOUNTER — Encounter (INDEPENDENT_AMBULATORY_CARE_PROVIDER_SITE_OTHER): Payer: Self-pay | Admitting: *Deleted

## 2023-06-18 ENCOUNTER — Encounter: Payer: Self-pay | Admitting: *Deleted

## 2023-06-18 NOTE — Telephone Encounter (Signed)
 Referral completed, [facility] apt letter sent to PCP

## 2023-06-18 NOTE — Patient Instructions (Signed)

## 2023-06-19 ENCOUNTER — Ambulatory Visit (INDEPENDENT_AMBULATORY_CARE_PROVIDER_SITE_OTHER): Payer: Medicaid Other | Admitting: Nurse Practitioner

## 2023-06-19 ENCOUNTER — Encounter: Payer: Self-pay | Admitting: Nurse Practitioner

## 2023-06-19 VITALS — BP 134/82 | HR 67 | Ht 72.0 in | Wt 193.4 lb

## 2023-06-19 DIAGNOSIS — E559 Vitamin D deficiency, unspecified: Secondary | ICD-10-CM

## 2023-06-19 DIAGNOSIS — I10 Essential (primary) hypertension: Secondary | ICD-10-CM

## 2023-06-19 DIAGNOSIS — Z794 Long term (current) use of insulin: Secondary | ICD-10-CM | POA: Diagnosis not present

## 2023-06-19 DIAGNOSIS — E1165 Type 2 diabetes mellitus with hyperglycemia: Secondary | ICD-10-CM | POA: Diagnosis not present

## 2023-06-19 DIAGNOSIS — E782 Mixed hyperlipidemia: Secondary | ICD-10-CM | POA: Diagnosis not present

## 2023-06-19 LAB — POCT GLYCOSYLATED HEMOGLOBIN (HGB A1C): Hemoglobin A1C: 11.4 % — AB (ref 4.0–5.6)

## 2023-06-19 MED ORDER — CAREFINE PEN NEEDLES 31G X 6 MM MISC: 6 refills | Status: AC

## 2023-06-19 MED ORDER — LANTUS SOLOSTAR 100 UNIT/ML ~~LOC~~ SOPN: 30.0000 [IU] | PEN_INJECTOR | Freq: Every day | SUBCUTANEOUS | 3 refills | Status: AC

## 2023-06-19 MED ORDER — INSULIN ASPART FLEXPEN 100 UNIT/ML ~~LOC~~ SOPN: 8.0000 [IU] | PEN_INJECTOR | Freq: Three times a day (TID) | SUBCUTANEOUS | 3 refills | Status: AC

## 2023-06-19 NOTE — Progress Notes (Signed)
 Endocrinology Consult Note       06/19/2023, 11:59 AM   Subjective:    Patient ID: Michael Orozco, male    DOB: Aug 27, 1965.  Michael Orozco is being seen in consultation for management of currently uncontrolled symptomatic diabetes requested by  Nira Retort, FNP.   Past Medical History:  Diagnosis Date   Diabetes mellitus without complication (HCC)    Hiatal hernia    Hypertension     History reviewed. No pertinent surgical history.  Social History   Socioeconomic History   Marital status: Married    Spouse name: Not on file   Number of children: Not on file   Years of education: Not on file   Highest education level: Not on file  Occupational History   Not on file  Tobacco Use   Smoking status: Every Day    Types: Cigarettes   Smokeless tobacco: Never  Substance and Sexual Activity   Alcohol use: Not on file    Comment: A Beer ever now and then   Drug use: Not Currently   Sexual activity: Not on file  Other Topics Concern   Not on file  Social History Narrative   Not on file   Social Drivers of Health   Financial Resource Strain: Low Risk  (02/28/2022)   Received from [facility], [facility]   Overall Financial Resource Strain (CARDIA)    Difficulty of Paying Living Expenses: Not hard at all  Food Insecurity: No Food Insecurity (03/20/2022)   Received from [facility], [facility]   Hunger Vital Sign    Worried About Running Out of Food in the Last Year: Never true    Ran Out of Food in the Last Year: Never true  Transportation Needs: No Transportation Needs (03/20/2022)   Received from [facility], [facility]   [facility] - Transportation    In the past 12 months, has lack of transportation kept you from medical appointments or from getting medications?: No    Lack of  Transportation (Non-Medical): No  Physical Activity: Not on file  Stress: Not on file  Social Connections: Not on file    History reviewed. No pertinent family history.  Outpatient Encounter Medications as of 06/19/2023  Medication Sig   amLODipine (NORVASC) 5 MG tablet Take 5 mg by mouth daily.   atorvastatin (LIPITOR) 40 MG tablet Take 40 mg by mouth daily.   BAQSIMI TWO PACK 3 MG/DOSE POWD Place 3 mg into the nose as needed.   Continuous Glucose Sensor (DEXCOM G7 SENSOR) MISC    insulin glargine (LANTUS) 100 UNIT/ML injection Inject into the skin daily.   Insulin Pen Needle (CAREFINE PEN NEEDLES) 31G X 6 MM MISC Use to inject insulin 4 times daily   potassium chloride (KLOR-CON) 10 MEQ tablet Take 10 mEq by mouth daily.   senna (SENOKOT) 8.6 MG tablet Take 1 tablet by mouth daily.   VENTOLIN HFA 108 (90 Base) MCG/ACT inhaler Inhale 2 puffs into the lungs as needed.   [DISCONTINUED] Insulin Aspart FlexPen (NOVOLOG) 100 UNIT/ML 3 Units in the morning, at noon, and at bedtime.   [  DISCONTINUED] LANTUS SOLOSTAR 100 UNIT/ML Solostar Pen Inject 30 Units into the skin at bedtime.   [DISCONTINUED] OZEMPIC, 0.25 OR 0.5 MG/DOSE, 2 MG/3ML SOPN Inject 2.5 mg into the skin once a week.   Insulin Aspart FlexPen (NOVOLOG) 100 UNIT/ML Inject 8-14 Units into the skin in the morning, at noon, and at bedtime.   LANTUS SOLOSTAR 100 UNIT/ML Solostar Pen Inject 30 Units into the skin at bedtime.   No facility-administered encounter medications on file as of 06/19/2023.    ALLERGIES: No Known Allergies  VACCINATION STATUS:  There is no immunization history on file for this patient.  Diabetes He presents for his initial diabetic visit. He has type 2 diabetes mellitus. Onset time: diagnosed at approx age of 51. His disease course has been fluctuating. Hypoglycemia symptoms include nervousness/anxiousness, sweats and tremors. Pertinent negatives for diabetes include no blurred vision, no fatigue, no  polydipsia, no polyuria, no weakness and no weight loss. There are no hypoglycemic complications. Symptoms are stable. 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 Pertinent negatives for diabetic complications include no peripheral neuropathy. Risk factors for coronary artery disease include diabetes mellitus, tobacco exposure, dyslipidemia, family history, male sex, hypertension and sedentary lifestyle. Current diabetic treatment includes intensive insulin program. He is compliant with treatment most of the time. His weight is stable. He is following a generally unhealthy diet. When asked about meal planning, he reported none. He has not had a previous visit with a dietitian. He rarely participates in exercise. His overall blood glucose range is >200 mg/dl. (He presents today for his consultation with his CGM showing no reading for the last 2 weeks (going to pick up refill when he leaves here today).  His POCT A1c today is 11.4%, improving from last A1c of >15%.  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  He drinks mostly water, an occasional soda (like once a week), and coffee with creamer.  He eats 3 meals per day and snacks occasionally too.  He does not engage in routine physical activity due to musculoskeletal issues (walks with cane, has severe arthritis but does not qualify for any surgery due to uncontrolled diabetes).  He is UTD on eye exam, has not seen podiatry in a very long time.) An ACE inhibitor/angiotensin II receptor blocker is not being taken. He does not see a podiatrist.Eye exam is current.     Review of systems  Constitutional: + Minimally fluctuating body weight, current Body mass index is 26.23 kg/m., no fatigue, no subjective hyperthermia, no subjective hypothermia Eyes: no blurry vision, no xerophthalmia ENT: no sore throat, no nodules palpated in throat, no dysphagia/odynophagia, no  hoarseness Cardiovascular: no chest pain, no shortness of breath, no palpitations, no leg swelling Respiratory: no cough, no shortness of breath Gastrointestinal: no nausea/vomiting/diarrhea Musculoskeletal: + diffuse muscle/joint aches- walks with cane Skin: no rashes, no hyperemia Neurological: no tremors, no numbness, no tingling, no dizziness Psychiatric: no depression, no anxiety  Objective:     BP 134/82 (BP Location: Right Arm, Patient Position: Sitting, Cuff Size: Large)   Pulse 67   Ht 6' (1.829 m)   Wt 193 lb 6.4 oz (87.7 kg)   BMI 26.23 kg/m   Wt Readings from Last 3 Encounters:  06/19/23 193 lb 6.4 oz (87.7 kg)  09/15/19 196 lb (88.9 kg)  02/23/19 190 lb (86.2 kg)     BP Readings from Last 3 Encounters:  06/19/23 134/82  09/15/19 Marland Kitchen)  156/74  02/23/19 (!) 190/99     Physical Exam- Limited  Constitutional:  Body mass index is 26.23 kg/m. , not in acute distress, normal state of mind Eyes:  EOMI, no exophthalmos Neck: Supple Cardiovascular: RRR, no murmurs, rubs, or gallops, no edema Respiratory: Adequate breathing efforts, no crackles, rales, rhonchi, or wheezing Musculoskeletal: no gross deformities, strength intact in all four extremities, no gross restriction of joint movements, walks with cane Skin:  no rashes, no hyperemia Neurological: no tremor with outstretched hands   Diabetic Foot Exam - Simple   No data filed      CMP ( most recent) CMP     Component Value Date/Time   NA 139 02/23/2019 1131   K 4.3 02/23/2019 1131   CL 108 02/23/2019 1131   CO2 23 02/23/2019 1131   GLUCOSE 90 02/23/2019 1131   BUN 34 (A) 10/15/2022 0000   CREATININE 2.4 (A) 10/15/2022 0000   CREATININE 0.94 02/23/2019 1131   CALCIUM 9.5 02/23/2019 1131   EGFR 31 10/15/2022 0000   GFRNONAA >60 02/23/2019 1131     Diabetic Labs (most recent): Lab Results  Component Value Date   HGBA1C 11.4 (A) 06/19/2023   HGBA1C >15 01/07/2023     Lipid Panel ( most  recent) Lipid Panel     Component Value Date/Time   TRIG 419 (A) 10/15/2022 0000      No results found for: "TSH", "FREET4"         Assessment & Plan:   1) Type 2 diabetes mellitus with hyperglycemia, with long-term current use of insulin (HCC) (Primary)  He presents today for his consultation with his CGM showing no reading for the last 2 weeks (going to pick up refill when he leaves here today).  His POCT A1c today is 11.4%, improving from last A1c of >15%.  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  He drinks mostly water, an occasional soda (like once a week), and coffee with creamer.  He eats 3 meals per day and snacks occasionally too.  He does not engage in routine physical activity due to musculoskeletal issues (walks with cane, has severe arthritis but does not qualify for any surgery due to uncontrolled diabetes).  He is UTD on eye exam, has not seen podiatry in a very long time.  - Michael Orozco has currently uncontrolled symptomatic type 2 DM since 58 years of age, with most recent A1c of 11.4 %.   -Recent labs reviewed.  - I had a long discussion with him about the progressive nature of diabetes and the pathology behind its complications. 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 These are all discussed in detail with him.  The following Lifestyle Medicine recommendations according to American College of Lifestyle Medicine [facility]) were discussed and offered to patient and he agrees to start the journey:  A. Whole Foods, Plant-based plate comprising of fruits and vegetables, plant-based proteins, whole-grain carbohydrates was discussed in detail with the patient.   A list for source of those nutrients were also provided to the patient.  Patient will use only water or unsweetened tea for hydration. B.  The need to stay away from  risky substances including alcohol, smoking; obtaining 7 to 9 hours of restorative sleep, at least 150 minutes of moderate intensity exercise weekly, the importance of healthy social connections,  and stress reduction  techniques were discussed. C.  A full color page of  Calorie density of various food groups per pound showing examples of each food groups was provided to the patient.  - I have counseled him on diet and weight management by adopting a carbohydrate restricted/protein rich diet. Patient is encouraged to switch to unprocessed or minimally processed complex starch and increased protein intake (animal or plant source), fruits, and vegetables. -  he is advised to stick to a routine mealtimes to eat 3 meals a day and avoid unnecessary snacks (to snack only to correct hypoglycemia).   - he acknowledges that there is a room for improvement in his food and drink choices. - Suggestion is made for him to avoid simple carbohydrates from his diet including Cakes, Sweet Desserts, Ice Cream, Soda (diet and regular), Sweet Tea, Candies, Chips, Cookies, Store Bought Juices, Alcohol in Excess of 1-2 drinks a day, Artificial Sweeteners, Coffee Creamer, and "Sugar-free" Products. This will help patient to have more stable blood glucose profile and potentially avoid unintended weight gain.  - I have approached him with the following individualized plan to manage his diabetes and patient agrees:   -He is advised to increase his Lantus to 30 units SQ nightly and adjust his Novolog to 8-14 units TID with meals if glucose is above 90 and he is eating (Specific instructions on how to titrate insulin dosage based on glucose readings given to patient in writing).  He demonstrated his ability to properly use the SSI chart with me today.  -he is encouraged to start/continue monitoring glucose 4 times daily, before meals and before bed, to log their readings on the clinic sheets provided, and bring them to review at  follow up appointment in 3 months.  - he is warned not to take insulin without proper monitoring per orders. - Adjustment parameters are given to him for hypo and hyperglycemia in writing. - he is encouraged to call clinic for blood glucose levels less than 70 or above 300 mg /dl.  - he is not a candidate for Metformin due to concurrent renal insufficiency.  - he is not an ideal candidate for incretin therapy given heavy smoking history, which increases his risk of pancreatitis, nor due to body habitus with BMI of around 25.  Due to this, I advised him to STOP the Ozempic.  - Specific targets for  A1c; LDL, HDL, and Triglycerides were discussed with the patient.  2) Blood Pressure /Hypertension:  his blood pressure is controlled to target.   he is advised to continue his current medications as prescribed by his PCP.  3) Lipids/Hyperlipidemia:    Review of his recent lipid panel from 10/15/22 showed significantly elevated triglycerides of 419, LDL not able to be calculated. he is advised to continue Atorvastatin 40 mg daily at bedtime and limited fatty, fried foods.  Side effects and precautions discussed with him.  4)  Weight/Diet:  his Body mass index is 26.23 kg/m.  - he is NOT a candidate for weight loss.  Exercise, and detailed carbohydrates information provided  -  detailed on discharge instructions.  5) Chronic Care/Health Maintenance: -he is not on ACEI/ARB and is on Statin medications and is encouraged to initiate and continue to follow up with Ophthalmology, Dentist, Podiatrist at least yearly or according to recommendations, and advised to QUIT SMOKING. I have recommended yearly flu vaccine and pneumonia vaccine at least every 5 years; moderate intensity exercise for up to 150 minutes weekly; and sleep for at least  7 hours a day.  - he is advised to maintain close follow up with Nira Retort, FNP for primary care needs, as well as his other providers for optimal and coordinated  care.   - Time spent in this patient care: 60 min, of which > 50% was spent in counseling him about his diabetes and the rest reviewing his blood glucose logs, discussing his hypoglycemia and hyperglycemia episodes, reviewing his current and previous labs/studies (including abstraction from other facilities) and medications doses and developing a long term treatment plan based on the latest standards of care/guidelines; and documenting his care.    Please refer to Patient Instructions for Blood Glucose Monitoring and Insulin/Medications Dosing Guide" in media tab for additional information. Please also refer to "Patient Self Inventory" in the Media tab for reviewed elements of pertinent patient history.  Michael Orozco participated in the discussions, expressed understanding, and voiced agreement with the above plans.  All questions were answered to his satisfaction. he is encouraged to contact clinic should he have any questions or concerns prior to his return visit.     Follow up plan: - Return in about 4 months (around 10/19/2023) for Diabetes F/U with A1c in office, No previsit labs, Bring meter and logs.    Ronny Bacon, [facility] [address] [place], [place] [address] Phone: [phone] Fax: [phone]  06/19/2023, 11:59 AM

## 2023-06-25 ENCOUNTER — Other Ambulatory Visit (HOSPITAL_COMMUNITY)
Admission: RE | Admit: 2023-06-25 | Discharge: 2023-06-25 | Disposition: A | Discharge status: Home / Self Care | Source: Ambulatory Visit | Attending: Internal Medicine | Admitting: Internal Medicine

## 2023-06-25 DIAGNOSIS — Z1211 Encounter for screening for malignant neoplasm of colon: Secondary | ICD-10-CM | POA: Diagnosis present

## 2023-06-25 LAB — BASIC METABOLIC PANEL WITH GFR
Anion gap: 7 (ref 5–15)
BUN: 22 mg/dL — ABNORMAL HIGH (ref 6–20)
CO2: 27 mmol/L (ref 22–32)
Calcium: 9.6 mg/dL (ref 8.9–10.3)
Chloride: 106 mmol/L (ref 98–111)
Creatinine, Ser: 2.03 mg/dL — ABNORMAL HIGH (ref 0.61–1.24)
GFR, Estimated: 37 mL/min — ABNORMAL LOW (ref >60)
Glucose, Bld: 59 mg/dL — ABNORMAL LOW (ref 70–99)
Potassium: 4 mmol/L (ref 3.5–5.1)
Sodium: 140 mmol/L (ref 135–145)

## 2023-07-08 ENCOUNTER — Telehealth: Payer: Self-pay | Admitting: *Deleted

## 2023-07-08 ENCOUNTER — Encounter: Payer: Self-pay | Admitting: *Deleted

## 2023-07-08 NOTE — Telephone Encounter (Signed)
 Pt called to reschedule his procedure on 07/09/23. He says he just got back from the ER and has been dx with the flu. He has been rescheduled for 07/19/23 at 8:00 am. Updated instructions mailed to pt.

## 2023-07-17 ENCOUNTER — Other Ambulatory Visit: Payer: Self-pay | Admitting: Internal Medicine

## 2023-07-17 ENCOUNTER — Other Ambulatory Visit: Payer: Self-pay | Admitting: *Deleted

## 2023-07-17 MED ORDER — PEG 3350-KCL-NA BICARB-NACL 420 G PO SOLR: 4000.0000 mL | Freq: Once | ORAL | 0 refills | Status: AC

## 2023-07-19 ENCOUNTER — Ambulatory Visit (HOSPITAL_COMMUNITY)
Admission: RE | Admit: 2023-07-19 | Discharge: 2023-07-19 | Disposition: A | Discharge status: Home / Self Care | Attending: Internal Medicine | Admitting: Internal Medicine

## 2023-07-19 ENCOUNTER — Ambulatory Visit (HOSPITAL_COMMUNITY): Payer: Self-pay | Admitting: Anesthesiology

## 2023-07-19 ENCOUNTER — Other Ambulatory Visit: Payer: Self-pay

## 2023-07-19 ENCOUNTER — Encounter (HOSPITAL_COMMUNITY)
Admission: RE | Disposition: A | Discharge status: Home / Self Care | Payer: Self-pay | Source: Home / Self Care | Attending: Internal Medicine

## 2023-07-19 ENCOUNTER — Encounter (HOSPITAL_COMMUNITY): Payer: Self-pay | Admitting: Internal Medicine

## 2023-07-19 DIAGNOSIS — I10 Essential (primary) hypertension: Secondary | ICD-10-CM | POA: Insufficient documentation

## 2023-07-19 DIAGNOSIS — D123 Benign neoplasm of transverse colon: Secondary | ICD-10-CM

## 2023-07-19 DIAGNOSIS — K648 Other hemorrhoids: Secondary | ICD-10-CM

## 2023-07-19 DIAGNOSIS — E119 Type 2 diabetes mellitus without complications: Secondary | ICD-10-CM | POA: Insufficient documentation

## 2023-07-19 DIAGNOSIS — J45909 Unspecified asthma, uncomplicated: Secondary | ICD-10-CM | POA: Diagnosis not present

## 2023-07-19 DIAGNOSIS — F17200 Nicotine dependence, unspecified, uncomplicated: Secondary | ICD-10-CM | POA: Insufficient documentation

## 2023-07-19 DIAGNOSIS — Z1211 Encounter for screening for malignant neoplasm of colon: Secondary | ICD-10-CM

## 2023-07-19 DIAGNOSIS — F17210 Nicotine dependence, cigarettes, uncomplicated: Secondary | ICD-10-CM

## 2023-07-19 HISTORY — DX: Unspecified asthma, uncomplicated: J45.909

## 2023-07-19 HISTORY — DX: Unspecified osteoarthritis, unspecified site: M19.90

## 2023-07-19 HISTORY — PX: COLONOSCOPY: SHX5424

## 2023-07-19 LAB — GLUCOSE, CAPILLARY: Glucose-Capillary: 188 mg/dL — ABNORMAL HIGH (ref 70–99)

## 2023-07-19 SURGERY — COLONOSCOPY
Anesthesia: General

## 2023-07-19 MED ORDER — PROPOFOL 500 MG/50ML IV EMUL
INTRAVENOUS | Status: DC | PRN
  Administered 2023-07-19: 150 ug/kg/min via INTRAVENOUS

## 2023-07-19 MED ORDER — LACTATED RINGERS IV SOLN: INTRAVENOUS | Status: DC | PRN

## 2023-07-19 MED ORDER — LIDOCAINE 2% (20 MG/ML) 5 ML SYRINGE
INTRAMUSCULAR | Status: DC | PRN
  Administered 2023-07-19: 50 mg via INTRAVENOUS

## 2023-07-19 NOTE — H&P (Signed)
 Primary Care Physician:  Evie Hoff, FNP Primary Gastroenterologist:  Dr. Mordechai April  Pre-Procedure History & Physical: HPI:  Michael Orozco is a 58 y.o. male is here for first ever colonoscopy for colon cancer screening purposes.  Patient denies any family history of colorectal cancer.    Past Medical History:  Diagnosis Date   Arthritis    Asthma    Diabetes mellitus without complication (HCC)    Hiatal hernia    Hypertension     Past Surgical History:  Procedure Laterality Date   PROSTATE SURGERY      Prior to Admission medications   Medication Sig Start Date End Date Taking? Authorizing Provider  amLODipine (NORVASC) 5 MG tablet Take 5 mg by mouth daily. 04/18/23  Yes [provider]  atorvastatin (LIPITOR) 40 MG tablet Take 40 mg by mouth daily.   Yes [provider]  Insulin  Aspart FlexPen (NOVOLOG ) 100 UNIT/ML Inject 8-14 Units into the skin in the morning, at noon, and at bedtime. 06/19/23  Yes Wendel Hals, NP  insulin  glargine (LANTUS ) 100 UNIT/ML injection Inject into the skin daily.   Yes [provider]  LANTUS  SOLOSTAR 100 UNIT/ML Solostar Pen Inject 30 Units into the skin at bedtime. 06/19/23  Yes Wendel Hals, NP  senna (SENOKOT) 8.6 MG tablet Take 1 tablet by mouth daily.   Yes [provider]  VENTOLIN HFA 108 (90 Base) MCG/ACT inhaler Inhale 2 puffs into the lungs as needed.   Yes [provider]  BAQSIMI TWO PACK 3 MG/DOSE POWD Place 3 mg into the nose as needed. 05/30/23   [provider]  Continuous Glucose Sensor (DEXCOM G7 SENSOR) MISC  05/17/23   [provider]  Insulin  Pen Needle (CAREFINE PEN NEEDLES) 31G X 6 MM MISC Use to inject insulin  4 times daily 06/19/23   Wendel Hals, NP  potassium chloride (KLOR-CON) 10 MEQ tablet Take 10 mEq by mouth daily.    [provider]    Allergies as of 06/10/2023   (No Known Allergies)    History reviewed. No pertinent family  history.  Social History   Socioeconomic History   Marital status: Married    Spouse name: Not on file   Number of children: Not on file   Years of education: Not on file   Highest education level: Not on file  Occupational History   Not on file  Tobacco Use   Smoking status: Every Day    Types: Cigarettes   Smokeless tobacco: Never  Vaping Use   Vaping status: Never Used  Substance and Sexual Activity   Alcohol use: Not on file    Comment: A Beer ever now and then   Drug use: Not Currently   Sexual activity: Not on file  Other Topics Concern   Not on file  Social History Narrative   Not on file   Social Drivers of Health   Financial Resource Strain: Low Risk  (02/28/2022)   Received from [facility], [facility]   Overall Financial Resource Strain (CARDIA)    Difficulty of Paying Living Expenses: Not hard at all  Food Insecurity: No Food Insecurity (03/20/2022)   Received from [facility], [facility]   Hunger Vital Sign    Worried About Running Out of Food in the Last Year: Never true    Ran Out of Food in the Last Year: Never true  Transportation Needs: No Transportation Needs (03/20/2022)  Received from [facility], [facility]   PRAPARE - Transportation    In the past 12 months, has lack of transportation kept you from medical appointments or from getting medications?: No    Lack of Transportation (Non-Medical): No  Physical Activity: Not on file  Stress: Not on file  Social Connections: Not on file  Intimate Partner Violence: Not on file    Review of Systems: See HPI, otherwise negative ROS  Physical Exam: Vital signs in last 24 hours: Temp:  [98.2 F (36.8 C)] 98.2 F (36.8 C) (05/02 0816) Pulse Rate:  [62] 62 (05/02 0816) Resp:  [22] 22 (05/02 0816) BP: (165)/(94) 165/94 (05/02 0816) SpO2:  [98 %] 98 % (05/02 0816) Weight:  [88.9 kg] 88.9 kg  (05/02 0816)   General:   Alert,  Well-developed, well-nourished, pleasant and cooperative in NAD Head:  Normocephalic and atraumatic. Eyes:  Sclera clear, no icterus.   Conjunctiva pink. Ears:  Normal auditory acuity. Nose:  No deformity, discharge,  or lesions. Msk:  Symmetrical without gross deformities. Normal posture. Extremities:  Without clubbing or edema. Neurologic:  Alert and  oriented x4;  grossly normal neurologically. Skin:  Intact without significant lesions or rashes. Psych:  Alert and cooperative. Normal mood and affect.  Impression/Plan: Michael Orozco is here for a colonoscopy to be performed for colon cancer screening purposes.  The risks of the procedure including infection, bleed, or perforation as well as benefits, limitations, alternatives and imponderables have been reviewed with the patient. Questions have been answered. All parties agreeable.

## 2023-07-19 NOTE — Anesthesia Postprocedure Evaluation (Signed)
 Anesthesia Post Note  Patient: Michael Orozco  Procedure(s) Performed: COLONOSCOPY  Patient location during evaluation: Endoscopy Anesthesia Type: General Level of consciousness: awake and alert Pain management: pain level controlled Vital Signs Assessment: post-procedure vital signs reviewed and stable Respiratory status: spontaneous breathing, nonlabored ventilation and respiratory function stable Cardiovascular status: blood pressure returned to baseline and stable Postop Assessment: no apparent nausea or vomiting Anesthetic complications: no   There were no known notable events for this encounter.   Last Vitals:  Vitals:   07/19/23 0816 07/19/23 0955  BP: (!) 165/94 107/66  Pulse: 62 64  Resp: (!) 22 19  Temp: 36.8 C 36.4 C  SpO2: 98% 96%    Last Pain:  Vitals:   07/19/23 0955  TempSrc: Oral  PainSc:                  Sandy Crumb

## 2023-07-19 NOTE — Transfer of Care (Signed)
 Immediate Anesthesia Transfer of Care Note  Patient: Michael Orozco  Procedure(s) Performed: COLONOSCOPY  Patient Location: Endoscopy Unit  Anesthesia Type:General  Level of Consciousness: awake  Airway & Oxygen Therapy: Patient Spontanous Breathing  Post-op Assessment: Report given to RN  Post vital signs: Reviewed and stable  Last Vitals:  Vitals Value Taken Time  BP 107/66 07/19/23 0955  Temp 36.4 C 07/19/23 0955  Pulse 64 07/19/23 0955  Resp 19 07/19/23 0955  SpO2 96 % 07/19/23 0955    Last Pain:  Vitals:   07/19/23 0955  TempSrc: Oral  PainSc:       Patients Stated Pain Goal: 9 (07/19/23 0816)  Complications: No notable events documented.

## 2023-07-19 NOTE — Anesthesia Preprocedure Evaluation (Addendum)
 Anesthesia Evaluation  Patient identified by MRN, date of birth, ID band Patient awake    Reviewed: Allergy & Precautions, H&P , NPO status , Patient's Chart, lab work & pertinent test results, reviewed documented beta blocker date and time   Airway Mallampati: II  TM Distance: >3 FB Neck ROM: full    Dental  (+) Edentulous Upper, Edentulous Lower   Pulmonary asthma , Current Smoker   Pulmonary exam normal breath sounds clear to auscultation       Cardiovascular Exercise Tolerance: Good hypertension, Normal cardiovascular exam Rhythm:regular Rate:Normal     Neuro/Psych negative neurological ROS  negative psych ROS   GI/Hepatic negative GI ROS, Neg liver ROS, hiatal hernia,,,  Endo/Other  diabetes, Type 2    Renal/GU negative Renal ROS  negative genitourinary   Musculoskeletal  (+) Arthritis ,    Abdominal   Peds  Hematology negative hematology ROS (+)   Anesthesia Other Findings   Reproductive/Obstetrics negative OB ROS                             Anesthesia Physical Anesthesia Plan  ASA: 3  Anesthesia Plan: General   Post-op Pain Management: Minimal or no pain anticipated   Induction: Intravenous  PONV Risk Score and Plan: Propofol infusion  Airway Management Planned: Natural Airway and Nasal Cannula  Additional Equipment: None  Intra-op Plan:   Post-operative Plan:   Informed Consent: I have reviewed the patients History and Physical, chart, labs and discussed the procedure including the risks, benefits and alternatives for the proposed anesthesia with the patient or authorized representative who has indicated his/her understanding and acceptance.     Dental Advisory Given  Plan Discussed with: CRNA  Anesthesia Plan Comments:        Anesthesia Quick Evaluation

## 2023-07-19 NOTE — Discharge Instructions (Addendum)
  Colonoscopy Discharge Instructions  Read the instructions outlined below and refer to this sheet in the next few weeks. These discharge instructions provide you with general information on caring for yourself after you leave the hospital. Your doctor may also give you specific instructions. While your treatment has been planned according to the most current medical practices available, unavoidable complications occasionally occur.   ACTIVITY You may resume your regular activity, but move at a slower pace for the next 24 hours.  Take frequent rest periods for the next 24 hours.  Walking will help get rid of the air and reduce the bloated feeling in your belly (abdomen).  No driving for 24 hours (because of the medicine (anesthesia) used during the test).   Do not sign any important legal documents or operate any machinery for 24 hours (because of the anesthesia used during the test).  NUTRITION Drink plenty of fluids.  You may resume your normal diet as instructed by your doctor.  Begin with a light meal and progress to your normal diet. Heavy or fried foods are harder to digest and may make you feel sick to your stomach (nauseated).  Avoid alcoholic beverages for 24 hours or as instructed.  MEDICATIONS You may resume your normal medications unless your doctor tells you otherwise.  WHAT YOU CAN EXPECT TODAY Some feelings of bloating in the abdomen.  Passage of more gas than usual.  Spotting of blood in your stool or on the toilet paper.  IF YOU HAD POLYPS REMOVED DURING THE COLONOSCOPY: No aspirin products for 7 days or as instructed.  No alcohol for 7 days or as instructed.  Eat a soft diet for the next 24 hours.  FINDING OUT THE RESULTS OF YOUR TEST Not all test results are available during your visit. If your test results are not back during the visit, make an appointment with your caregiver to find out the results. Do not assume everything is normal if you have not heard from your  caregiver or the medical facility. It is important for you to follow up on all of your test results.  SEEK IMMEDIATE MEDICAL ATTENTION IF: You have more than a spotting of blood in your stool.  Your belly is swollen (abdominal distention).  You are nauseated or vomiting.  You have a temperature over 101.  You have abdominal pain or discomfort that is severe or gets worse throughout the day.   Your colonoscopy revealed 1 polyp(s) which I removed successfully. Await pathology results, my office will contact you. I recommend repeating colonoscopy in 7 years for surveillance purposes.   Otherwise follow up with GI as needed.   I hope you have a great rest of your week!  Charles K. Carver, D.O. Gastroenterology and Hepatology [facility]  

## 2023-07-19 NOTE — Op Note (Signed)
 [facility] Patient Name: Michael Orozco Procedure Date: 07/19/2023 9:14 AM MRN: [id number] Date of Birth: [birth year] Attending MD: Rolando Cliche. Mordechai April , [place], [id number] CSN: [id number] Age: 58 Admit Type: Outpatient Procedure:                Colonoscopy Indications:              Screening for colorectal malignant neoplasm Providers:                Rolando Cliche. Mordechai April, DO, Willena Harp, Jolee Naval, Technician Referring MD:              Medicines:                See the Anesthesia note for documentation of the                            administered medications Complications:            No immediate complications. Estimated Blood Loss:     Estimated blood loss was minimal. Procedure:                Pre-Anesthesia Assessment:                           - The anesthesia plan was to use monitored                            anesthesia care (MAC).                           After obtaining informed consent, the colonoscope                            was passed under direct vision. Throughout the                            procedure, the patient's blood pressure, pulse, and                            oxygen saturations were monitored continuously. The                            [id number]-HQ190L ([id number]) scope was introduced through                            the anus and advanced to the the cecum, identified                            by appendiceal orifice and ileocecal valve. The                            colonoscopy was performed without difficulty. The                            patient tolerated the procedure well. The  quality                            of the bowel preparation was evaluated using the                            BBPS [facility] Bowel Preparation Scale) with scores                            of: Right Colon = 3, Transverse Colon = 3 and Left                            Colon = 3 (entire mucosa seen well with no residual                             staining, small fragments of stool or opaque                            liquid). The total BBPS score equals 9. Scope In: 9:33:04 AM Scope Out: 9:46:48 AM Scope Withdrawal Time: 0 hours 9 minutes 59 seconds  Total Procedure Duration: 0 hours 13 minutes 44 seconds  Findings:      Non-bleeding internal hemorrhoids were found.      A 6 mm polyp was found in the transverse colon. The polyp was sessile.       The polyp was removed with a cold snare. Resection and retrieval were       complete.      The exam was otherwise without abnormality. Impression:               - Non-bleeding internal hemorrhoids.                           - One 6 mm polyp in the transverse colon, removed                            with a cold snare. Resected and retrieved.                           - The examination was otherwise normal. Moderate Sedation:      Per Anesthesia Care Recommendation:           - Patient has a contact number available for                            emergencies. The signs and symptoms of potential                            delayed complications were discussed with the                            patient. Return to normal activities tomorrow.                            Written discharge instructions were provided to the  patient.                           - Resume previous diet.                           - Continue present medications.                           - Await pathology results.                           - Repeat colonoscopy in 7 years for surveillance.                           - Return to GI clinic PRN. Procedure Code(s):        --- Professional ---                           [phone], Colonoscopy, flexible; with removal of                            tumor(s), polyp(s), or other lesion(s) by snare                            technique Diagnosis Code(s):        --- Professional ---                           Z12.11, Encounter for screening for malignant                             neoplasm of colon                           D12.3, Benign neoplasm of transverse colon (hepatic                            flexure or splenic flexure)                           K64.8, Other hemorrhoids CPT copyright 2022 [employer]. All rights reserved. The codes documented in this report are preliminary and upon coder review may  be revised to meet current compliance requirements. Rolando Cliche. Mordechai April, DO Rolando Cliche. Mordechai April, DO 07/19/2023 9:54:38 AM This report has been signed electronically. Number of Addenda: 0

## 2023-07-22 ENCOUNTER — Encounter (HOSPITAL_COMMUNITY): Payer: Self-pay | Admitting: Internal Medicine

## 2023-07-22 LAB — SURGICAL PATHOLOGY

## 2023-10-21 ENCOUNTER — Ambulatory Visit: Admitting: Nurse Practitioner

## 2023-10-21 DIAGNOSIS — Z794 Long term (current) use of insulin: Secondary | ICD-10-CM

## 2023-10-21 DIAGNOSIS — E782 Mixed hyperlipidemia: Secondary | ICD-10-CM

## 2023-10-21 DIAGNOSIS — I10 Essential (primary) hypertension: Secondary | ICD-10-CM

## 2023-10-21 DIAGNOSIS — E559 Vitamin D deficiency, unspecified: Secondary | ICD-10-CM

## 2024-04-06 ENCOUNTER — Ambulatory Visit: Admitting: Nurse Practitioner

## 2024-04-06 DIAGNOSIS — I10 Essential (primary) hypertension: Secondary | ICD-10-CM

## 2024-04-06 DIAGNOSIS — Z794 Long term (current) use of insulin: Secondary | ICD-10-CM

## 2024-04-06 DIAGNOSIS — E559 Vitamin D deficiency, unspecified: Secondary | ICD-10-CM

## 2024-04-06 DIAGNOSIS — E782 Mixed hyperlipidemia: Secondary | ICD-10-CM

## 2024-04-06 DIAGNOSIS — E1165 Type 2 diabetes mellitus with hyperglycemia: Secondary | ICD-10-CM

## 2024-04-09 ENCOUNTER — Telehealth: Payer: Self-pay | Admitting: Nurse Practitioner

## 2024-04-09 NOTE — Telephone Encounter (Signed)
 Pt's social worker called and said that they are having issues with his transportation picking him up. He will call us  to rsch

## 2024-04-09 NOTE — Telephone Encounter (Signed)
 ok
